# Patient Record
Sex: Female | Born: 1985 | Race: Black or African American | Hispanic: No | Marital: Single | Smoking: Current every day smoker
Health system: Southern US, Community
[De-identification: ages and names within clinical notes are randomized; demographics above are authoritative.]

## PROBLEM LIST (undated history)

## (undated) DIAGNOSIS — I10 Essential (primary) hypertension: Secondary | ICD-10-CM

## (undated) HISTORY — PX: TUBAL LIGATION: SHX77

---
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
## 2003-06-04 ENCOUNTER — Emergency Department (HOSPITAL_COMMUNITY): Admission: EM | Admit: 2003-06-04 | Discharge: 2003-06-04 | Payer: Self-pay | Admitting: Emergency Medicine

## 2003-12-25 ENCOUNTER — Inpatient Hospital Stay (HOSPITAL_COMMUNITY): Admission: AD | Admit: 2003-12-25 | Discharge: 2003-12-25 | Payer: Self-pay | Admitting: *Deleted

## 2003-12-25 ENCOUNTER — Observation Stay (HOSPITAL_COMMUNITY): Admission: AD | Admit: 2003-12-25 | Discharge: 2003-12-25 | Payer: Self-pay | Admitting: Obstetrics

## 2004-01-10 ENCOUNTER — Inpatient Hospital Stay (HOSPITAL_COMMUNITY): Admission: AD | Admit: 2004-01-10 | Discharge: 2004-01-10 | Payer: Self-pay | Admitting: Obstetrics

## 2004-01-13 ENCOUNTER — Inpatient Hospital Stay (HOSPITAL_COMMUNITY): Admission: AD | Admit: 2004-01-13 | Discharge: 2004-01-15 | Payer: Self-pay | Admitting: Obstetrics

## 2004-05-02 ENCOUNTER — Emergency Department (HOSPITAL_COMMUNITY): Admission: EM | Admit: 2004-05-02 | Discharge: 2004-05-02 | Payer: Self-pay | Admitting: Emergency Medicine

## 2004-05-05 ENCOUNTER — Emergency Department (HOSPITAL_COMMUNITY): Admission: EM | Admit: 2004-05-05 | Discharge: 2004-05-05 | Payer: Self-pay

## 2004-09-07 ENCOUNTER — Inpatient Hospital Stay (HOSPITAL_COMMUNITY): Admission: AD | Admit: 2004-09-07 | Discharge: 2004-09-07 | Payer: Self-pay | Admitting: *Deleted

## 2004-11-08 ENCOUNTER — Inpatient Hospital Stay (HOSPITAL_COMMUNITY): Admission: AD | Admit: 2004-11-08 | Discharge: 2004-11-08 | Payer: Self-pay | Admitting: Obstetrics

## 2004-11-19 ENCOUNTER — Inpatient Hospital Stay (HOSPITAL_COMMUNITY): Admission: AD | Admit: 2004-11-19 | Discharge: 2004-11-21 | Payer: Self-pay | Admitting: Obstetrics

## 2005-03-29 ENCOUNTER — Emergency Department (HOSPITAL_COMMUNITY): Admission: EM | Admit: 2005-03-29 | Discharge: 2005-03-29 | Payer: Self-pay | Admitting: Emergency Medicine

## 2005-11-18 ENCOUNTER — Emergency Department (HOSPITAL_COMMUNITY): Admission: EM | Admit: 2005-11-18 | Discharge: 2005-11-18 | Payer: Self-pay | Admitting: Family Medicine

## 2006-02-23 IMAGING — US US OB COMP LESS 14 WK
1 series · 14 of 28 positions shown · non-contrast
Comparison: none

CLINICAL DATA: Abdominal pain. 
TRANSABDOMINAL AND TRANSVAGINAL PELVIC FIRST TRIMESTER OB ULTRASOUND ? 05/02/04:
No prior studies.

[Series 1: unknown · 0.28mm/px · 14 of 55 slices shown]
[im 3/55]
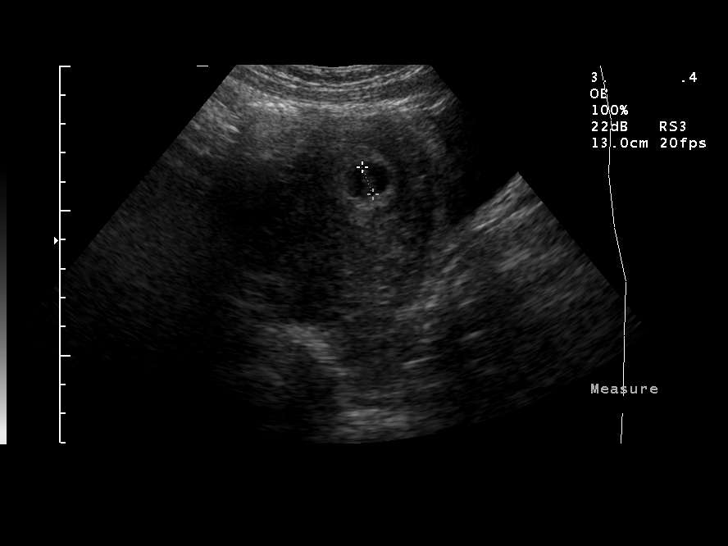
[im 7/55]
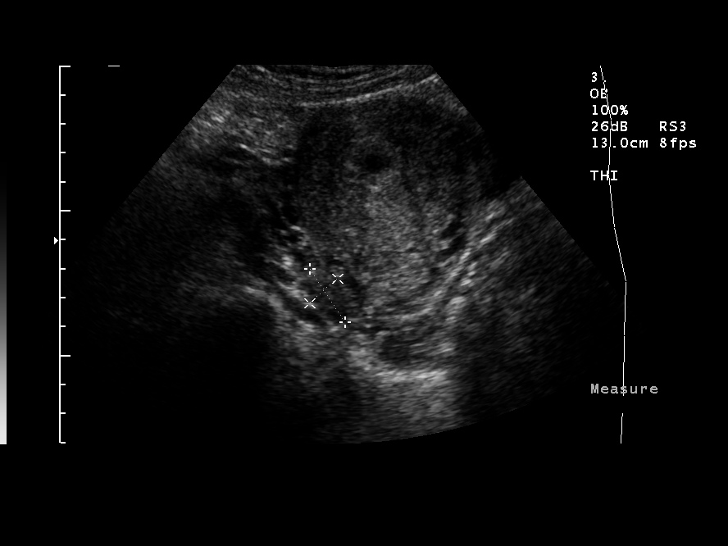
[im 11/55]
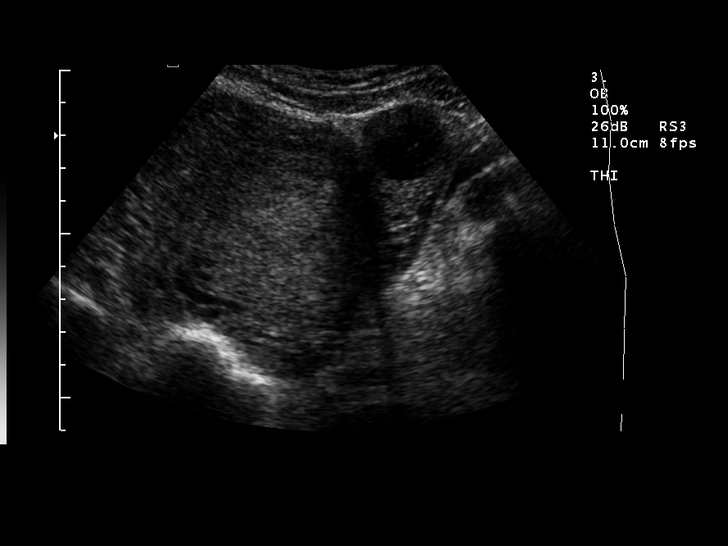
[im 15/55]
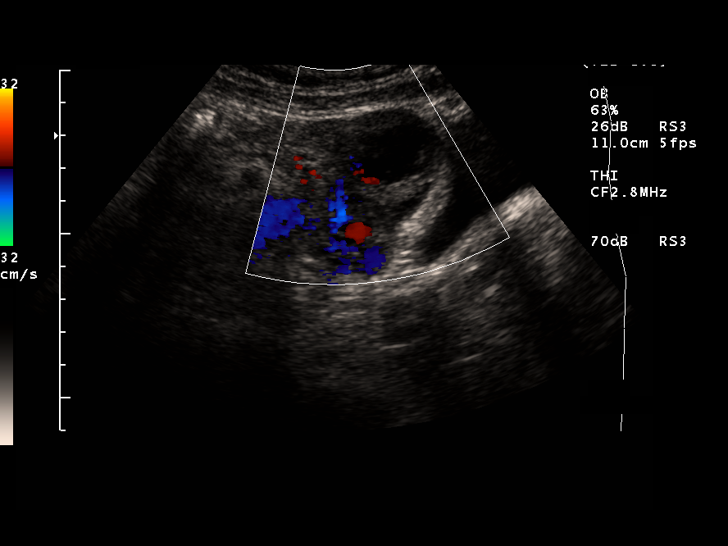
[im 19/55]
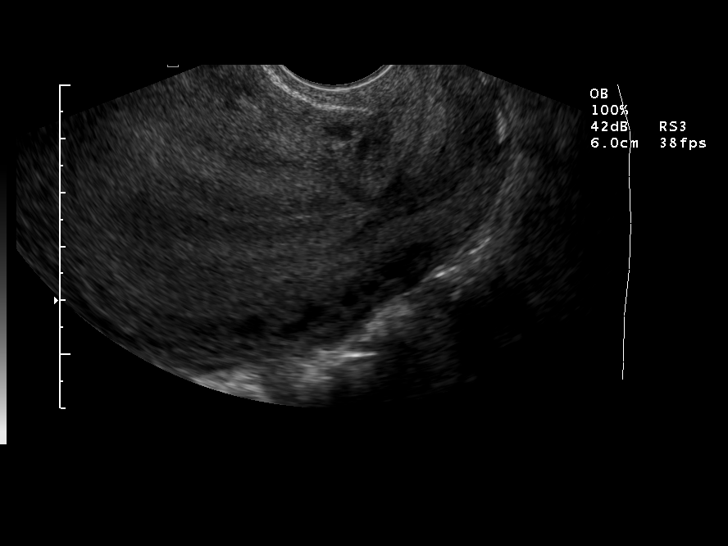
[im 23/55]
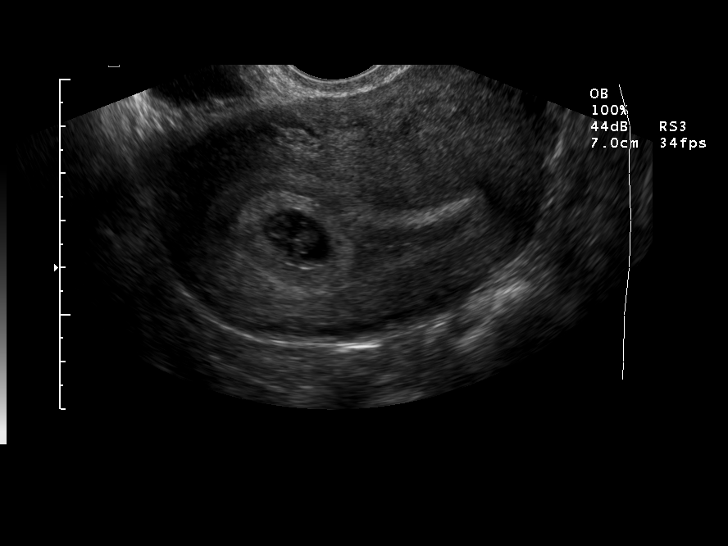
[im 27/55]
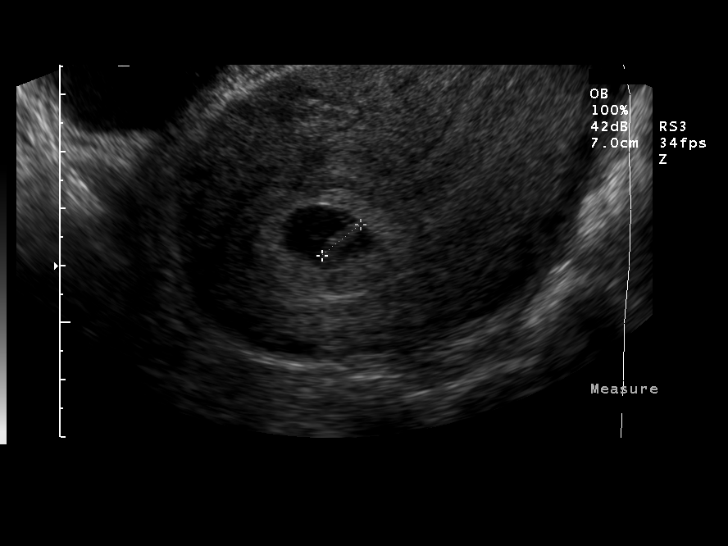
[im 31/55]
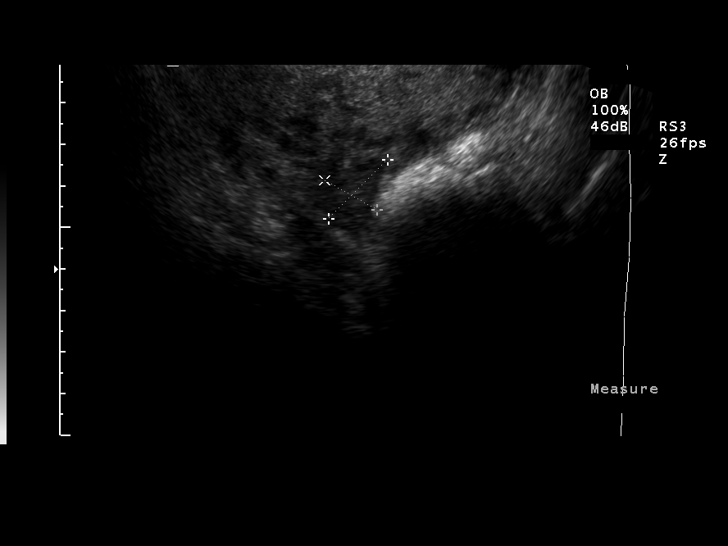
[im 35/55]
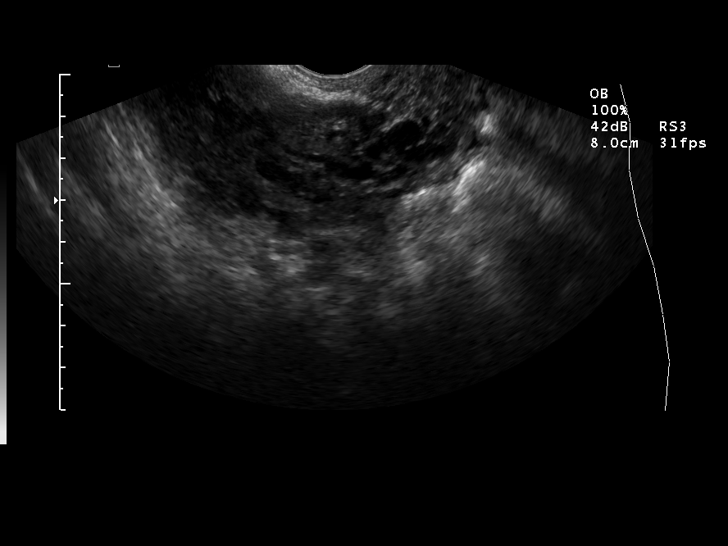
[im 39/55]
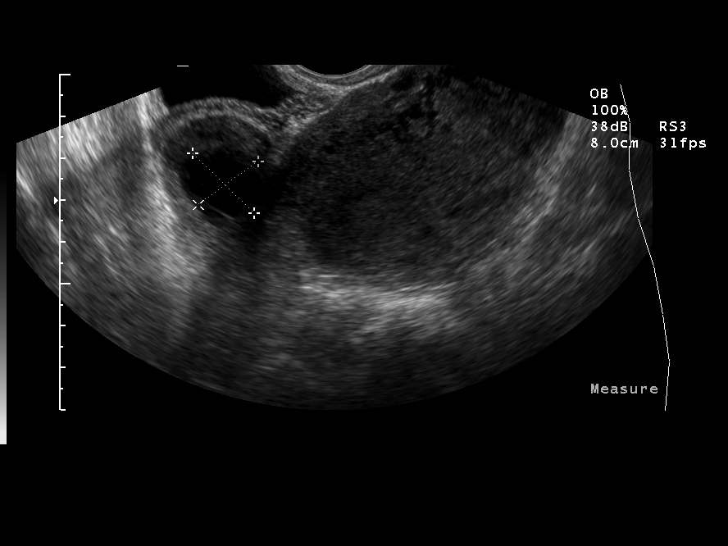
[im 43/55]
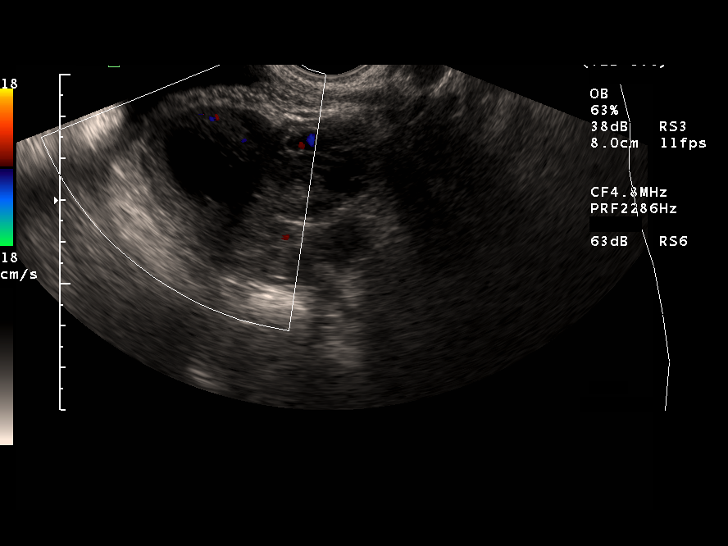
[im 47/55]
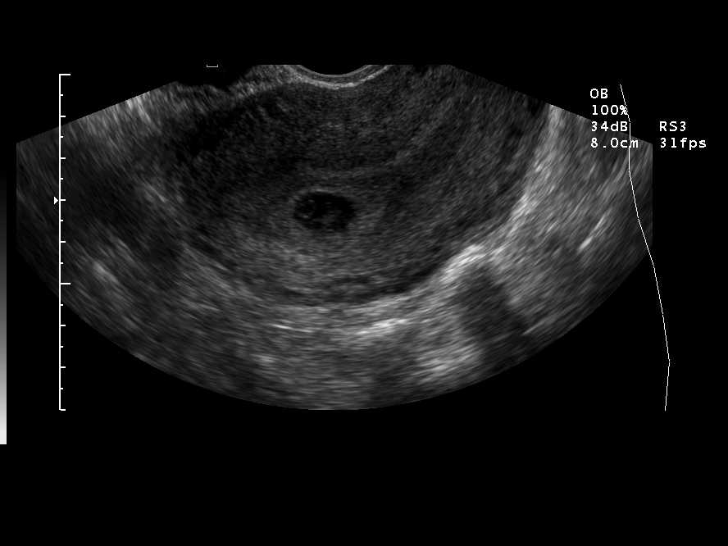
[im 51/55]
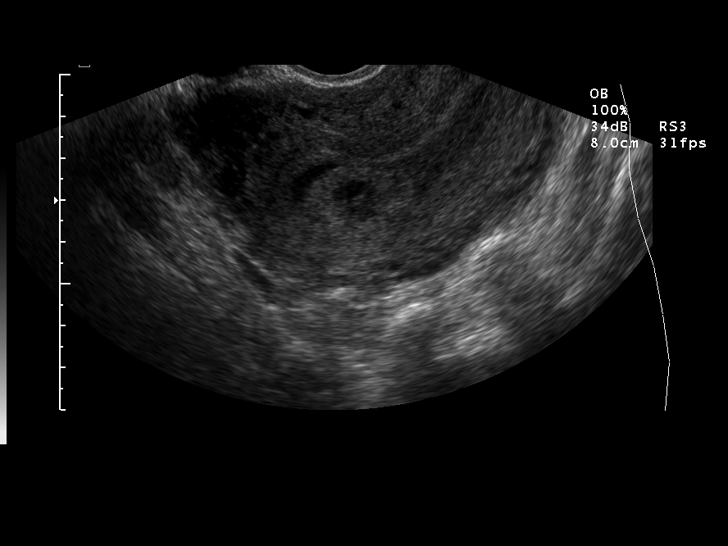
[im 55/55]
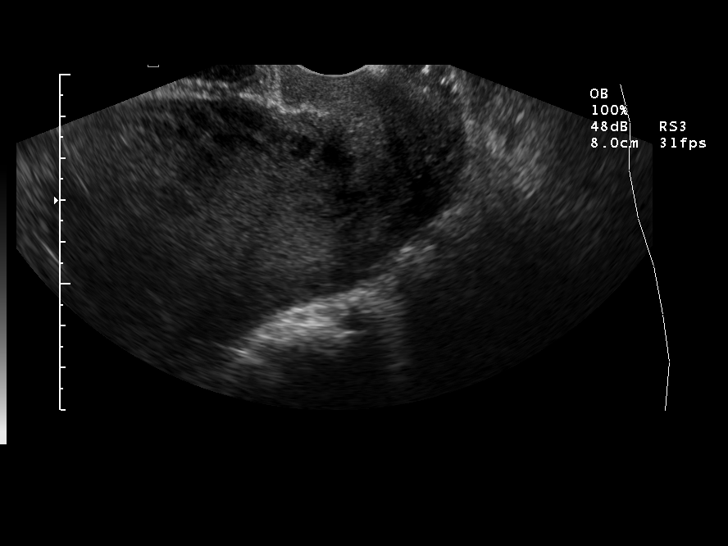

[14 of 28 positions shown; findings below may reference images not displayed]

FINDINGS: Transabdominal and transvaginal sonography was employed.   
There is an intrauterine gestational sac with a visualized embryo.  Crown/rump length is 8.3 mm consistent with 6 weeks, 6 days gestation.  Yolk sac is visible and fetal cardiac activity is identified at 142 beats per minute.  
There is a miniscule amount of hypoechogenicity along the fundal portion of the gestational sac potentially a manifestation of small subchorionic hemorrhage.  
The right ovary appears unremarkable.  The left ovary contains a 2.0 x 1.8 x 1.9 cm cystic lesion likely representing corpus luteum cyst.
IMPRESSION: 1. Single living intrauterine pregnancy measuring at 6 weeks, 6 days with fetal cardiac activity identified. No free pelvic fluid is noted.  
2.  Corpus luteum cyst of the left ovary.

## 2006-04-28 ENCOUNTER — Emergency Department (HOSPITAL_COMMUNITY): Admission: EM | Admit: 2006-04-28 | Discharge: 2006-04-28 | Payer: Self-pay | Admitting: Emergency Medicine

## 2006-06-24 ENCOUNTER — Inpatient Hospital Stay (HOSPITAL_COMMUNITY): Admission: AD | Admit: 2006-06-24 | Discharge: 2006-06-25 | Payer: Self-pay | Admitting: Obstetrics

## 2006-08-25 ENCOUNTER — Inpatient Hospital Stay (HOSPITAL_COMMUNITY): Admission: AD | Admit: 2006-08-25 | Discharge: 2006-08-25 | Payer: Self-pay | Admitting: Obstetrics

## 2006-10-15 ENCOUNTER — Ambulatory Visit (HOSPITAL_COMMUNITY): Admission: RE | Admit: 2006-10-15 | Discharge: 2006-10-15 | Payer: Self-pay | Admitting: Obstetrics

## 2007-01-07 ENCOUNTER — Inpatient Hospital Stay (HOSPITAL_COMMUNITY): Admission: AD | Admit: 2007-01-07 | Discharge: 2007-01-09 | Payer: Self-pay | Admitting: Obstetrics

## 2008-02-19 IMAGING — CR DG CHEST 2V
2 series · 2 of 2 positions shown · non-contrast
Comparison: None.

CLINICAL DATA: Chest pain

[view not recorded (1 of 2)]
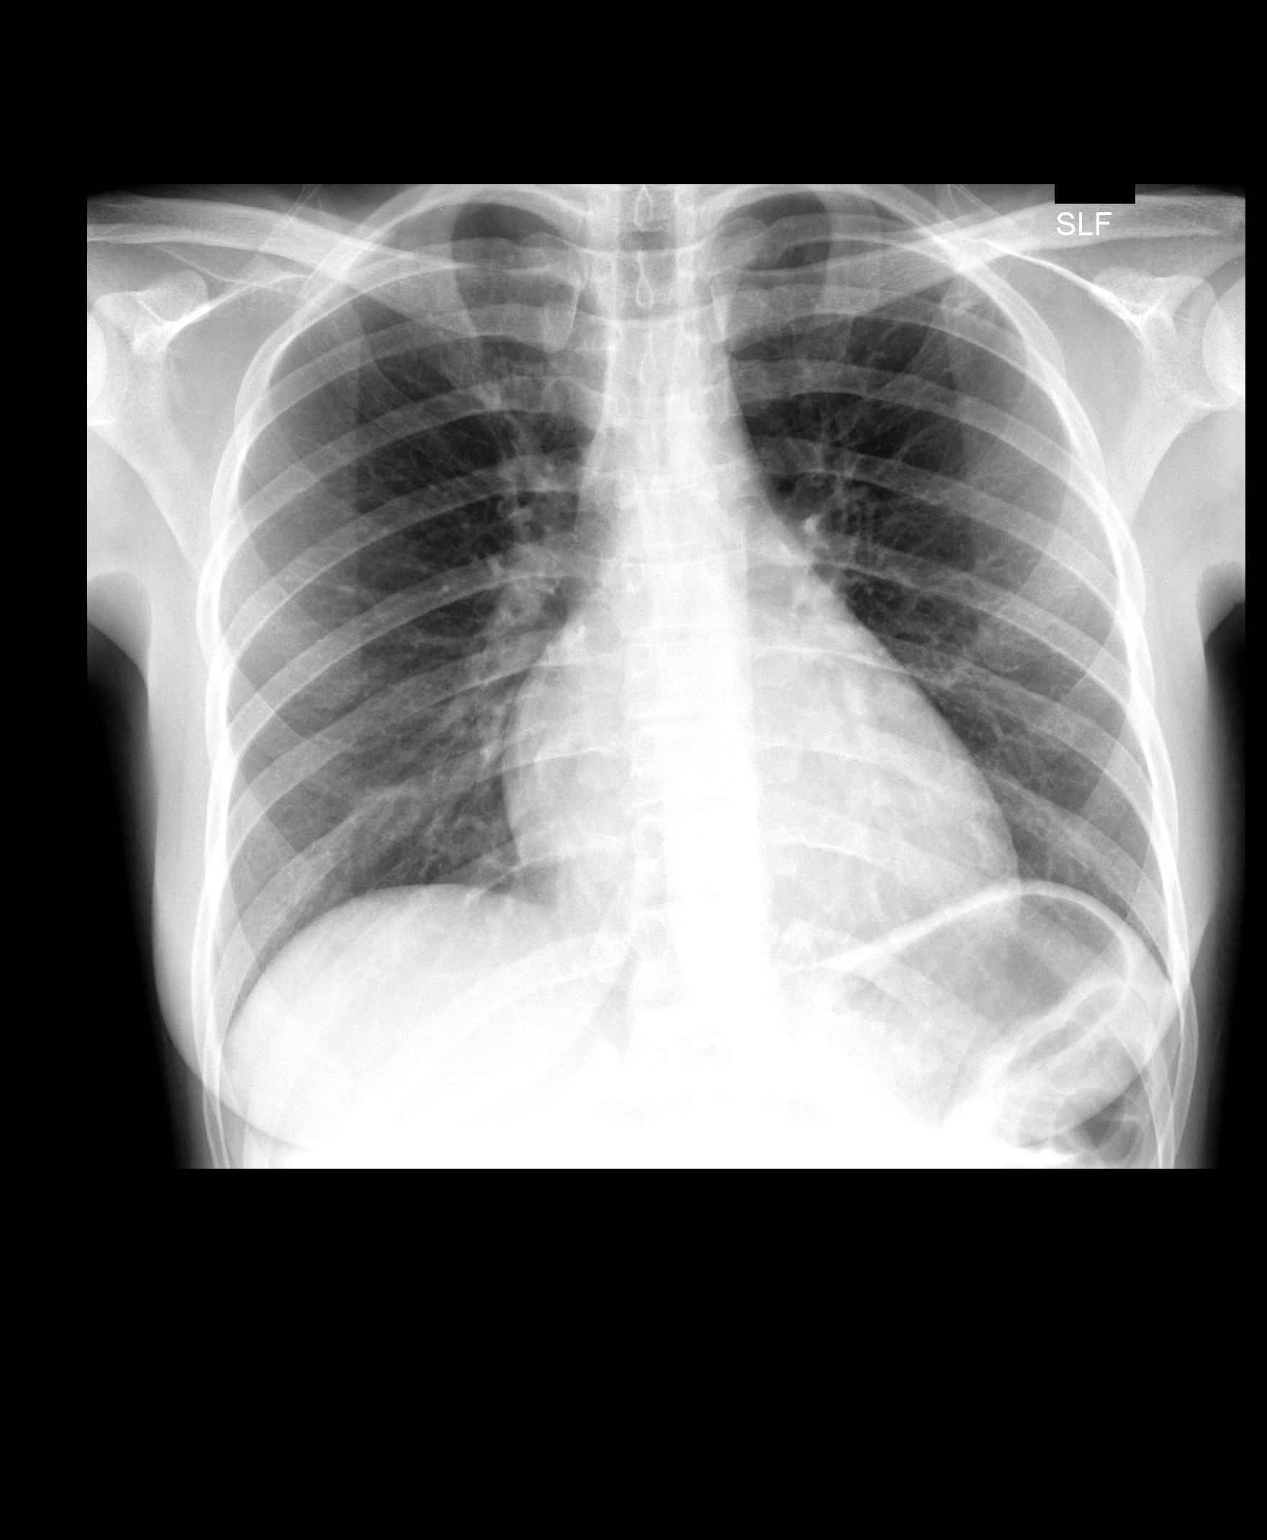

[view not recorded (2 of 2)]
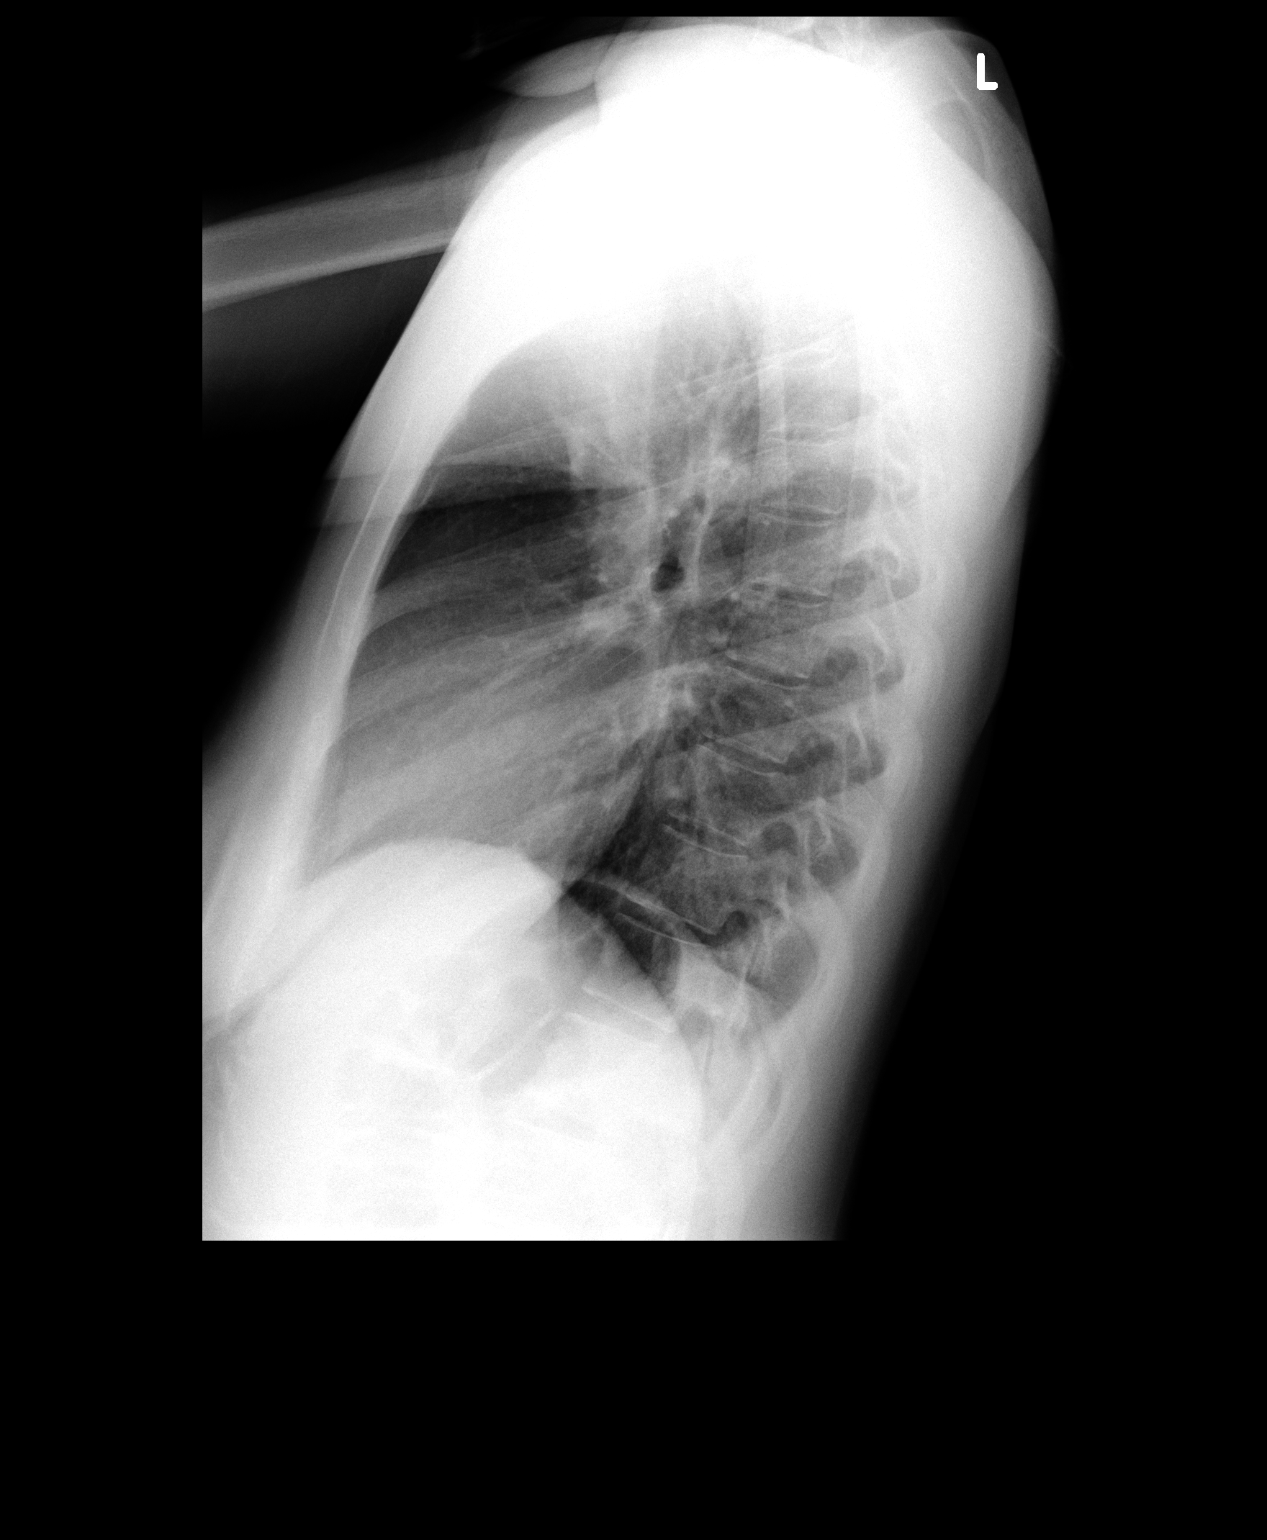

[2 of 2 positions shown; findings below may reference images not displayed]

CHEST - 2 VIEW:

The lungs are clear.  The cardiopericardial silhouette is within normal limits
for size.  Visualized bony structures of the thorax are intact.
IMPRESSION: No acute cardiopulmonary process

## 2008-07-17 ENCOUNTER — Emergency Department (HOSPITAL_COMMUNITY): Admission: EM | Admit: 2008-07-17 | Discharge: 2008-07-17 | Payer: Self-pay | Admitting: Family Medicine

## 2008-07-17 ENCOUNTER — Ambulatory Visit: Payer: Self-pay | Admitting: Obstetrics and Gynecology

## 2008-07-17 ENCOUNTER — Inpatient Hospital Stay (HOSPITAL_COMMUNITY): Admission: AD | Admit: 2008-07-17 | Discharge: 2008-07-17 | Payer: Self-pay | Admitting: Obstetrics & Gynecology

## 2008-07-28 ENCOUNTER — Inpatient Hospital Stay (HOSPITAL_COMMUNITY): Admission: AD | Admit: 2008-07-28 | Discharge: 2008-08-01 | Payer: Self-pay | Admitting: Obstetrics & Gynecology

## 2008-09-16 ENCOUNTER — Encounter: Payer: Self-pay | Admitting: Family

## 2008-09-16 ENCOUNTER — Ambulatory Visit: Payer: Self-pay | Admitting: Obstetrics & Gynecology

## 2008-09-16 ENCOUNTER — Encounter: Payer: Self-pay | Admitting: Advanced Practice Midwife

## 2008-09-16 LAB — CONVERTED CEMR LAB
Antibody Screen: NEGATIVE
Basophils Absolute: 0 10*3/uL
Basophils Relative: 0 %
Eosinophils Absolute: 0.5 10*3/uL
Eosinophils Relative: 4 %
HCT: 34.7 % — ABNORMAL LOW
Hemoglobin: 11.7 g/dL — ABNORMAL LOW
Hepatitis B Surface Ag: NEGATIVE
Hgb A2 Quant: 2.8 %
Hgb A: 96.7 % — ABNORMAL LOW
Hgb F Quant: 0.5 %
Hgb S Quant: 0 %
Lymphocytes Relative: 29 %
Lymphs Abs: 3.5 10*3/uL
MCHC: 33.7 g/dL
MCV: 96.1 fL
Monocytes Absolute: 1.2 10*3/uL — ABNORMAL HIGH
Monocytes Relative: 10 %
Neutro Abs: 6.6 10*3/uL
Neutrophils Relative %: 56 %
Platelets: 176 10*3/uL
RBC: 3.61 M/uL — ABNORMAL LOW
RDW: 14.2 %
Rh Type: POSITIVE
Rubella: 90.4 [IU]/mL — ABNORMAL HIGH
WBC: 11.8 10*3/uL — ABNORMAL HIGH

## 2008-09-26 ENCOUNTER — Inpatient Hospital Stay (HOSPITAL_COMMUNITY): Admission: AD | Admit: 2008-09-26 | Discharge: 2008-09-27 | Payer: Self-pay | Admitting: Obstetrics & Gynecology

## 2008-09-26 ENCOUNTER — Ambulatory Visit: Payer: Self-pay | Admitting: Advanced Practice Midwife

## 2008-10-07 ENCOUNTER — Ambulatory Visit: Payer: Self-pay | Admitting: Obstetrics & Gynecology

## 2008-10-21 ENCOUNTER — Encounter: Payer: Self-pay | Admitting: Family

## 2008-10-21 ENCOUNTER — Ambulatory Visit: Payer: Self-pay | Admitting: Obstetrics & Gynecology

## 2008-10-29 ENCOUNTER — Inpatient Hospital Stay (HOSPITAL_COMMUNITY): Admission: AD | Admit: 2008-10-29 | Discharge: 2008-10-29 | Payer: Self-pay | Admitting: Obstetrics & Gynecology

## 2008-10-29 ENCOUNTER — Ambulatory Visit: Payer: Self-pay | Admitting: Obstetrics and Gynecology

## 2008-11-04 ENCOUNTER — Encounter: Payer: Self-pay | Admitting: Family

## 2008-11-04 ENCOUNTER — Ambulatory Visit: Payer: Self-pay | Admitting: Obstetrics & Gynecology

## 2008-11-05 ENCOUNTER — Encounter: Payer: Self-pay | Admitting: Family

## 2008-11-05 LAB — CONVERTED CEMR LAB
Trich, Wet Prep: NONE SEEN
Yeast Wet Prep HPF POC: NONE SEEN

## 2008-11-06 ENCOUNTER — Ambulatory Visit: Payer: Self-pay | Admitting: Obstetrics and Gynecology

## 2008-11-11 ENCOUNTER — Ambulatory Visit: Payer: Self-pay | Admitting: Obstetrics & Gynecology

## 2008-11-18 ENCOUNTER — Ambulatory Visit: Payer: Self-pay | Admitting: Obstetrics & Gynecology

## 2008-11-23 ENCOUNTER — Inpatient Hospital Stay (HOSPITAL_COMMUNITY): Admission: AD | Admit: 2008-11-23 | Discharge: 2008-11-24 | Payer: Self-pay | Admitting: Obstetrics & Gynecology

## 2008-11-23 ENCOUNTER — Ambulatory Visit: Payer: Self-pay | Admitting: Obstetrics & Gynecology

## 2009-02-04 ENCOUNTER — Inpatient Hospital Stay (HOSPITAL_COMMUNITY): Admission: EM | Admit: 2009-02-04 | Discharge: 2009-02-08 | Payer: Self-pay | Admitting: Emergency Medicine

## 2009-02-04 ENCOUNTER — Ambulatory Visit: Payer: Self-pay | Admitting: Internal Medicine

## 2009-02-05 ENCOUNTER — Encounter: Payer: Self-pay | Admitting: Internal Medicine

## 2009-02-06 ENCOUNTER — Encounter (INDEPENDENT_AMBULATORY_CARE_PROVIDER_SITE_OTHER): Payer: Self-pay | Admitting: Surgery

## 2009-05-09 ENCOUNTER — Emergency Department (HOSPITAL_COMMUNITY): Admission: EM | Admit: 2009-05-09 | Discharge: 2009-05-09 | Payer: Self-pay | Admitting: Emergency Medicine

## 2009-12-10 ENCOUNTER — Emergency Department (HOSPITAL_COMMUNITY): Admission: EM | Admit: 2009-12-10 | Discharge: 2009-12-10 | Payer: Self-pay | Admitting: Family Medicine

## 2010-02-05 ENCOUNTER — Emergency Department (HOSPITAL_COMMUNITY): Admission: EM | Admit: 2010-02-05 | Discharge: 2010-02-05 | Payer: Self-pay | Admitting: Emergency Medicine

## 2010-06-28 ENCOUNTER — Inpatient Hospital Stay (INDEPENDENT_AMBULATORY_CARE_PROVIDER_SITE_OTHER)
Admission: RE | Admit: 2010-06-28 | Discharge: 2010-06-28 | Disposition: A | Discharge status: Home / Self Care | Payer: Self-pay | Source: Ambulatory Visit | Attending: Family Medicine | Admitting: Family Medicine

## 2010-06-28 DIAGNOSIS — G44209 Tension-type headache, unspecified, not intractable: Secondary | ICD-10-CM

## 2010-07-06 ENCOUNTER — Emergency Department (HOSPITAL_COMMUNITY)
Admission: EM | Admit: 2010-07-06 | Discharge: 2010-07-06 | Disposition: A | Discharge status: Home / Self Care | Payer: Self-pay | Attending: Emergency Medicine | Admitting: Emergency Medicine

## 2010-07-06 DIAGNOSIS — R51 Headache: Secondary | ICD-10-CM | POA: Insufficient documentation

## 2010-07-06 DIAGNOSIS — S0990XA Unspecified injury of head, initial encounter: Secondary | ICD-10-CM | POA: Insufficient documentation

## 2010-07-06 DIAGNOSIS — S0100XA Unspecified open wound of scalp, initial encounter: Secondary | ICD-10-CM | POA: Insufficient documentation

## 2010-07-15 LAB — POCT URINALYSIS DIPSTICK
Ketones, ur: NEGATIVE mg/dL
Protein, ur: NEGATIVE mg/dL
Specific Gravity, Urine: 1.02 (ref 1.005–1.030)
pH: 6.5 (ref 5.0–8.0)

## 2010-07-16 LAB — WET PREP, GENITAL
Trich, Wet Prep: NONE SEEN
Yeast Wet Prep HPF POC: NONE SEEN

## 2010-07-16 LAB — URINALYSIS, ROUTINE W REFLEX MICROSCOPIC
Glucose, UA: NEGATIVE mg/dL
Ketones, ur: NEGATIVE mg/dL
Nitrite: NEGATIVE
Protein, ur: 30 mg/dL — AB

## 2010-07-16 LAB — URINE CULTURE: Colony Count: >=100000

## 2010-07-16 LAB — URINE MICROSCOPIC-ADD ON

## 2010-08-04 LAB — CBC
HCT: 35.3 % — ABNORMAL LOW (ref 36.0–46.0)
HCT: 36.8 % (ref 36.0–46.0)
HCT: 39 % (ref 36.0–46.0)
Hemoglobin: 11.6 g/dL — ABNORMAL LOW (ref 12.0–15.0)
Hemoglobin: 12.5 g/dL (ref 12.0–15.0)
MCHC: 32.1 g/dL (ref 30.0–36.0)
MCHC: 33 g/dL (ref 30.0–36.0)
MCV: 95.7 fL (ref 78.0–100.0)
MCV: 96.7 fL (ref 78.0–100.0)
MCV: 96.7 fL (ref 78.0–100.0)
MCV: 97.4 fL (ref 78.0–100.0)
Platelets: 137 10*3/uL — ABNORMAL LOW (ref 150–400)
Platelets: 175 10*3/uL (ref 150–400)
RBC: 3.81 MIL/uL — ABNORMAL LOW (ref 3.87–5.11)
RBC: 4.04 MIL/uL (ref 3.87–5.11)
RBC: 4.14 MIL/uL (ref 3.87–5.11)
RDW: 12.6 % (ref 11.5–15.5)
RDW: 13 % (ref 11.5–15.5)
WBC: 15.6 10*3/uL — ABNORMAL HIGH (ref 4.0–10.5)
WBC: 7.2 10*3/uL (ref 4.0–10.5)
WBC: 8.3 10*3/uL (ref 4.0–10.5)

## 2010-08-04 LAB — COMPREHENSIVE METABOLIC PANEL
ALT: 65 U/L — ABNORMAL HIGH (ref 0–35)
AST: 98 U/L — ABNORMAL HIGH (ref 0–37)
Alkaline Phosphatase: 82 U/L (ref 39–117)
CO2: 24 mEq/L (ref 19–32)
Chloride: 107 mEq/L (ref 96–112)
GFR calc Af Amer: >60 mL/min (ref >60)
GFR calc non Af Amer: >60 mL/min (ref >60)
Potassium: 3.7 mEq/L (ref 3.5–5.1)
Sodium: 138 mEq/L (ref 135–145)
Total Bilirubin: 0.4 mg/dL (ref 0.3–1.2)

## 2010-08-04 LAB — DIFFERENTIAL
Basophils Absolute: 0 10*3/uL (ref 0.0–0.1)
Basophils Absolute: 0.1 10*3/uL (ref 0.0–0.1)
Basophils Relative: 1 % (ref 0–1)
Eosinophils Absolute: 0.1 10*3/uL (ref 0.0–0.7)
Eosinophils Absolute: 0.3 10*3/uL (ref 0.0–0.7)
Eosinophils Relative: 1 % (ref 0–5)
Eosinophils Relative: 5 % (ref 0–5)
Monocytes Absolute: 0.6 10*3/uL (ref 0.1–1.0)
Monocytes Absolute: 0.7 10*3/uL (ref 0.1–1.0)
Monocytes Relative: 8 % (ref 3–12)
Neutro Abs: 3.3 10*3/uL (ref 1.7–7.7)

## 2010-08-04 LAB — URINALYSIS, ROUTINE W REFLEX MICROSCOPIC
Bilirubin Urine: NEGATIVE
Glucose, UA: NEGATIVE mg/dL
Hgb urine dipstick: NEGATIVE
Specific Gravity, Urine: 1.024 (ref 1.005–1.030)
Urobilinogen, UA: 0.2 mg/dL (ref 0.0–1.0)

## 2010-08-04 LAB — BASIC METABOLIC PANEL
CO2: 24 mEq/L (ref 19–32)
Calcium: 8.6 mg/dL (ref 8.4–10.5)
Calcium: 9.1 mg/dL (ref 8.4–10.5)
Chloride: 110 mEq/L (ref 96–112)
GFR calc Af Amer: >60 mL/min (ref >60)
GFR calc non Af Amer: >60 mL/min (ref >60)
Glucose, Bld: 85 mg/dL (ref 70–99)
Potassium: 3.9 mEq/L (ref 3.5–5.1)
Sodium: 138 mEq/L (ref 135–145)
Sodium: 140 mEq/L (ref 135–145)

## 2010-08-04 LAB — LIPASE, BLOOD
Lipase: 18 U/L (ref 11–59)
Lipase: 22 U/L (ref 11–59)

## 2010-08-04 LAB — HEPATIC FUNCTION PANEL
ALT: 56 U/L — ABNORMAL HIGH (ref 0–35)
AST: 35 U/L (ref 0–37)
Alkaline Phosphatase: 65 U/L (ref 39–117)
Bilirubin, Direct: 0.1 mg/dL (ref 0.0–0.3)
Bilirubin, Direct: 0.1 mg/dL (ref 0.0–0.3)
Indirect Bilirubin: 0.3 mg/dL (ref 0.3–0.9)
Indirect Bilirubin: 0.6 mg/dL (ref 0.3–0.9)
Total Bilirubin: 0.4 mg/dL (ref 0.3–1.2)
Total Protein: 5.7 g/dL — ABNORMAL LOW (ref 6.0–8.3)

## 2010-08-04 LAB — URINE MICROSCOPIC-ADD ON

## 2010-08-04 LAB — BILIRUBIN, TOTAL: Total Bilirubin: 0.6 mg/dL (ref 0.3–1.2)

## 2010-08-04 LAB — URINE CULTURE: Colony Count: NO GROWTH

## 2010-08-07 LAB — CBC
HCT: 37.3 % (ref 36.0–46.0)
Hemoglobin: 12.7 g/dL (ref 12.0–15.0)
MCHC: 34.1 g/dL (ref 30.0–36.0)
MCHC: 35.3 g/dL (ref 30.0–36.0)
MCV: 97.3 fL (ref 78.0–100.0)
MCV: 97.7 fL (ref 78.0–100.0)
Platelets: 123 10*3/uL — ABNORMAL LOW (ref 150–400)
Platelets: 163 10*3/uL (ref 150–400)
RDW: 13.4 % (ref 11.5–15.5)
RDW: 13.8 % (ref 11.5–15.5)

## 2010-08-07 LAB — POCT URINALYSIS DIP (DEVICE)
Bilirubin Urine: NEGATIVE
Hgb urine dipstick: NEGATIVE
Ketones, ur: NEGATIVE mg/dL
Protein, ur: NEGATIVE mg/dL
pH: 6.5 (ref 5.0–8.0)

## 2010-08-08 LAB — POCT URINALYSIS DIP (DEVICE)
Bilirubin Urine: NEGATIVE
Bilirubin Urine: NEGATIVE
Bilirubin Urine: NEGATIVE
Glucose, UA: NEGATIVE mg/dL
Glucose, UA: NEGATIVE mg/dL
Glucose, UA: NEGATIVE mg/dL
Glucose, UA: NEGATIVE mg/dL
Hgb urine dipstick: NEGATIVE
Ketones, ur: NEGATIVE mg/dL
Ketones, ur: NEGATIVE mg/dL
Nitrite: NEGATIVE
Nitrite: NEGATIVE
Nitrite: NEGATIVE
Protein, ur: NEGATIVE mg/dL
Specific Gravity, Urine: 1.015 (ref 1.005–1.030)
Urobilinogen, UA: 0.2 mg/dL (ref 0.0–1.0)
Urobilinogen, UA: 1 mg/dL (ref 0.0–1.0)
pH: 6.5 (ref 5.0–8.0)

## 2010-08-09 LAB — POCT URINALYSIS DIP (DEVICE)
Bilirubin Urine: NEGATIVE
Glucose, UA: NEGATIVE mg/dL
Hgb urine dipstick: NEGATIVE
Ketones, ur: NEGATIVE mg/dL
Nitrite: NEGATIVE

## 2010-08-09 LAB — COMPREHENSIVE METABOLIC PANEL
ALT: 16 U/L (ref 0–35)
BUN: 2 mg/dL — ABNORMAL LOW (ref 6–23)
CO2: 22 mEq/L (ref 19–32)
Calcium: 8.6 mg/dL (ref 8.4–10.5)
Creatinine, Ser: 0.42 mg/dL (ref 0.4–1.2)
GFR calc non Af Amer: >60 mL/min (ref >60)
Glucose, Bld: 97 mg/dL (ref 70–99)

## 2010-08-09 LAB — CBC
HCT: 32.5 % — ABNORMAL LOW (ref 36.0–46.0)
Hemoglobin: 11.5 g/dL — ABNORMAL LOW (ref 12.0–15.0)
MCHC: 35.5 g/dL (ref 30.0–36.0)
MCV: 96.3 fL (ref 78.0–100.0)
RBC: 3.37 MIL/uL — ABNORMAL LOW (ref 3.87–5.11)

## 2010-08-09 LAB — DIFFERENTIAL
Eosinophils Absolute: 0.4 10*3/uL (ref 0.0–0.7)
Lymphocytes Relative: 20 % (ref 12–46)
Lymphs Abs: 2.5 10*3/uL (ref 0.7–4.0)
Neutrophils Relative %: 69 % (ref 43–77)

## 2010-08-09 LAB — WET PREP, GENITAL: Yeast Wet Prep HPF POC: NONE SEEN

## 2010-08-09 LAB — GC/CHLAMYDIA PROBE AMP, GENITAL: Chlamydia, DNA Probe: NEGATIVE

## 2010-08-10 LAB — CBC
Hemoglobin: 10 g/dL — ABNORMAL LOW (ref 12.0–15.0)
MCHC: 34.2 g/dL (ref 30.0–36.0)
RBC: 3.09 MIL/uL — ABNORMAL LOW (ref 3.87–5.11)
WBC: 15.6 10*3/uL — ABNORMAL HIGH (ref 4.0–10.5)

## 2010-08-10 LAB — DIFFERENTIAL
Lymphocytes Relative: 11 % — ABNORMAL LOW (ref 12–46)
Lymphs Abs: 1.7 10*3/uL (ref 0.7–4.0)
Monocytes Absolute: 1.8 10*3/uL — ABNORMAL HIGH (ref 0.1–1.0)
Monocytes Relative: 12 % (ref 3–12)
Neutro Abs: 11.7 10*3/uL — ABNORMAL HIGH (ref 1.7–7.7)
Neutrophils Relative %: 75 % (ref 43–77)

## 2010-08-11 LAB — URINALYSIS, ROUTINE W REFLEX MICROSCOPIC
Ketones, ur: >80 mg/dL — AB
Specific Gravity, Urine: 1.015 (ref 1.005–1.030)
Urobilinogen, UA: 1 mg/dL (ref 0.0–1.0)

## 2010-08-11 LAB — CBC
HCT: 31.5 % — ABNORMAL LOW (ref 36.0–46.0)
Hemoglobin: 11.4 g/dL — ABNORMAL LOW (ref 12.0–15.0)
MCHC: 34.2 g/dL (ref 30.0–36.0)
MCV: 95.2 fL (ref 78.0–100.0)
RBC: 3.3 MIL/uL — ABNORMAL LOW (ref 3.87–5.11)
RBC: 3.51 MIL/uL — ABNORMAL LOW (ref 3.87–5.11)
WBC: 19.5 10*3/uL — ABNORMAL HIGH (ref 4.0–10.5)
WBC: 20.7 10*3/uL — ABNORMAL HIGH (ref 4.0–10.5)

## 2010-08-11 LAB — COMPREHENSIVE METABOLIC PANEL
BUN: 1 mg/dL — ABNORMAL LOW (ref 6–23)
CO2: 21 mEq/L (ref 19–32)
Chloride: 104 mEq/L (ref 96–112)
Creatinine, Ser: 0.43 mg/dL (ref 0.4–1.2)
GFR calc non Af Amer: >60 mL/min (ref >60)
Glucose, Bld: 77 mg/dL (ref 70–99)
Total Bilirubin: 0.4 mg/dL (ref 0.3–1.2)

## 2010-08-11 LAB — URINE CULTURE: Colony Count: >=100000

## 2010-08-11 LAB — WET PREP, GENITAL: Yeast Wet Prep HPF POC: NONE SEEN

## 2010-08-11 LAB — POCT URINALYSIS DIP (DEVICE)
Bilirubin Urine: NEGATIVE
Glucose, UA: NEGATIVE mg/dL
Ketones, ur: NEGATIVE mg/dL
Nitrite: POSITIVE — AB
Specific Gravity, Urine: 1.02 (ref 1.005–1.030)
pH: 6 (ref 5.0–8.0)

## 2010-08-11 LAB — GC/CHLAMYDIA PROBE AMP, GENITAL
Chlamydia, DNA Probe: POSITIVE — AB
GC Probe Amp, Genital: POSITIVE — AB

## 2010-08-11 LAB — URINE MICROSCOPIC-ADD ON

## 2010-09-13 NOTE — Op Note (Signed)
Theresa Avila, Theresa Avila                 ACCOUNT NO.:  [id number]   MEDICAL RECORD NO.:  [id number]          PATIENT TYPE:  INP   LOCATION:  9111                          FACILITY:  [facility]   PHYSICIAN:  Scheryl Darter, MD       DATE OF BIRTH:  12-26-1985   DATE OF PROCEDURE:  11/23/2008  DATE OF DISCHARGE:                               OPERATIVE REPORT   PREOPERATIVE DIAGNOSIS:  Undesired fertility.   POSTOPERATIVE DIAGNOSES:  1. Tubal sterilization.  2. Pelvic adhesion.   PROCEDURE:  Postpartum bilateral tubal ligation.   SURGEON:  Scheryl Darter, MD   ANESTHESIA:  Spinal.   ESTIMATED BLOOD LOSS:  Negligible.   SPECIMENS:  None.   DRAINS:  None.   COUNTS:  Correct.   HOSPITAL COURSE:  The patient gave written consent for postpartum tubal  ligation.  The patient identification was confirmed.  She was brought to  the operating room and spinal anesthesia was induced.  She was placed in  dorsal supine position.  Abdomen was sterilely prepped and draped, and  bladder was drained with a Foley catheter.  Foley was not kept in situ.  A #11 blade was used to make transverse infraumbilical skin incision  about 2 cm across.  Fascia was elevated and incised and peritoneal  cavity was entered.  The abdominal cavity was inspected and the right  fallopian tube was identified and traced to its distal fimbriated end.  A Filshie clip was applied about 1/3rd down the tube from the uterus  with good positioning seen.  Omental adhesion to anterior abdominal wall  was sliced after ligating with 0 Vicryl and cutting with scissors.  The  left fallopian tube was identified and likewise a Filshie clip was  applied at about the proximal 1/3rd section of the tube.  The fascia and  peritoneum were closed with a  running suture with 0 Vicryl and skin was closed with interrupted  subcuticular sutures of 4-0 Vicryl.  Sterile dressing was applied.  The  patient tolerated the procedure well without complications  and she was  brought in stable condition to recovery room.       Scheryl Darter, MD  Electronically Signed     JA/MEDQ  D:  11/23/2008  T:  11/24/2008  Job:  [id number]

## 2010-09-16 NOTE — Discharge Summary (Signed)
Theresa Avila, Theresa Avila                 ACCOUNT NO.:  [id number]   MEDICAL RECORD NO.:  [id number]          PATIENT TYPE:  INP   LOCATION:  9311                          FACILITY:  [facility]   PHYSICIAN:  Roseanna Rainbow, M.D.DATE OF BIRTH:  09-Jun-1985   DATE OF ADMISSION:  07/28/2008  DATE OF DISCHARGE:  08/01/2008                               DISCHARGE SUMMARY   CHIEF COMPLAINT:  The patient is a 25 year old with a second trimester  intrauterine pregnancy recently treated for cervicitis, now with rule  out pyelonephritis.  Please see the dictated history and physical as per  the mid-level Latrisha Coiro.   HOSPITAL COURSE:  The patient was admitted.  She was started on  parenteral antibiotics.  On hospital day#1, her white blood cell count  was 19,500.  On admission, the white blood cell count was 20,700.  Her  flank pain improved.  An ultrasound showed a 21-week living intrauterine  pregnancy.  On hospital day #3, her white blood cell count was 15,600.  She continued to improve symptomatically and she was subsequently  discharged to home on April 3.   DISCHARGE DIAGNOSES:  Pyelonephritis, intrauterine pregnancy at 21 plus  weeks.   CONDITION:  Improved.   DIET:  Regular.   ACTIVITY:  Ad lib.   MEDICATIONS:  1. Ampicillin 500 mg q.6 for 7 days.  2. Tylenol as needed.  3. Prenatal vitamins.   DISPOSITION:  The patient was to follow up in the office in 2 weeks.      Roseanna Rainbow, M.D.  Electronically Signed     LAJ/MEDQ  D:  08/20/2008  T:  08/20/2008  Job:  [id number]

## 2010-11-05 ENCOUNTER — Inpatient Hospital Stay (INDEPENDENT_AMBULATORY_CARE_PROVIDER_SITE_OTHER)
Admission: RE | Admit: 2010-11-05 | Discharge: 2010-11-05 | Disposition: A | Discharge status: Home / Self Care | Payer: Self-pay | Source: Ambulatory Visit | Attending: Family Medicine | Admitting: Family Medicine

## 2010-11-05 DIAGNOSIS — S0100XA Unspecified open wound of scalp, initial encounter: Secondary | ICD-10-CM

## 2010-11-28 IMAGING — US US ABDOMEN COMPLETE
1 series · 13 of 25 positions shown · non-contrast
Comparison: None relevant.

CLINICAL DATA: Right upper quadrant abdominal pain with nausea and
vomiting.

COMPLETE ABDOMINAL ULTRASOUND

[Series 1: us abdomen complete · 0.28mm/px · 13 of 86 slices shown]
[im 1/86]
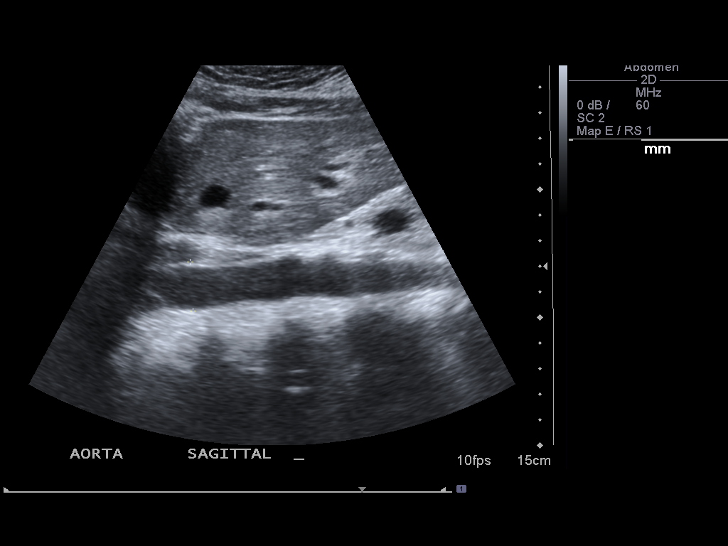
[im 8/86]
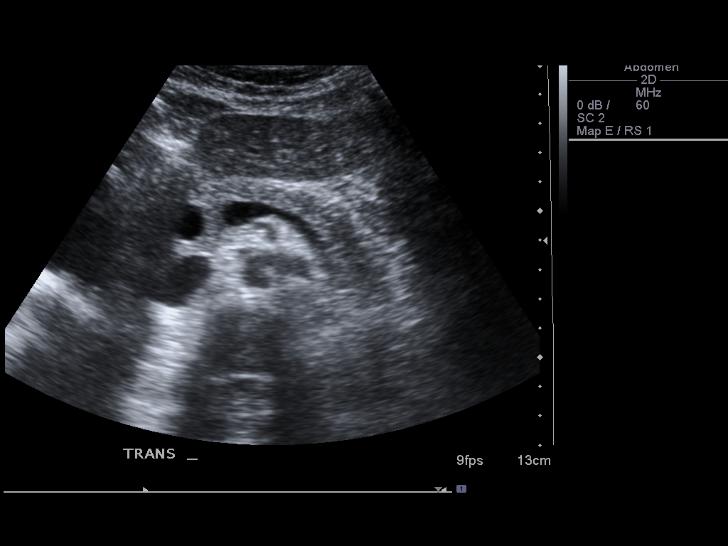
[im 15/86]
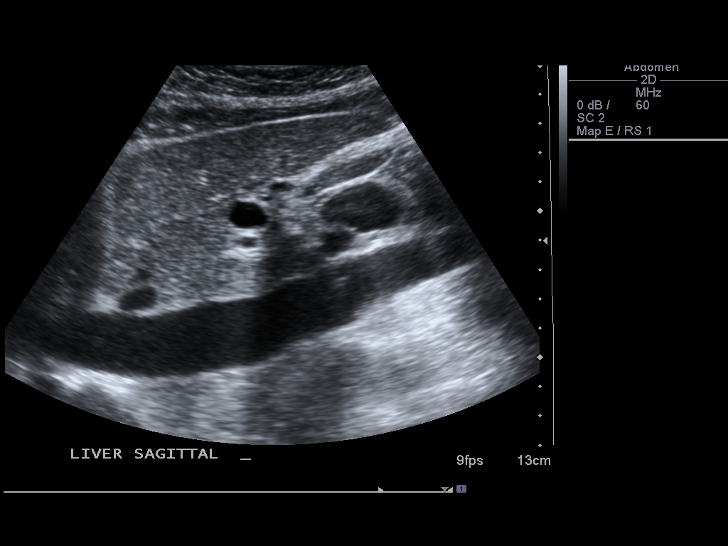
[im 22/86]
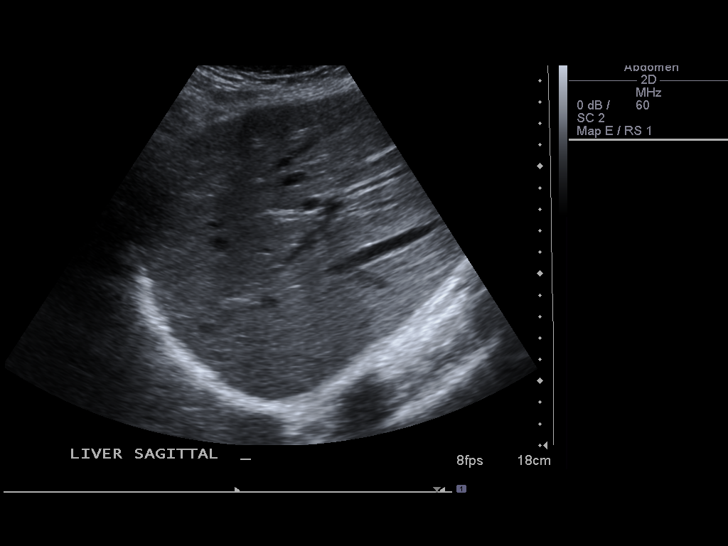
[im 29/86]
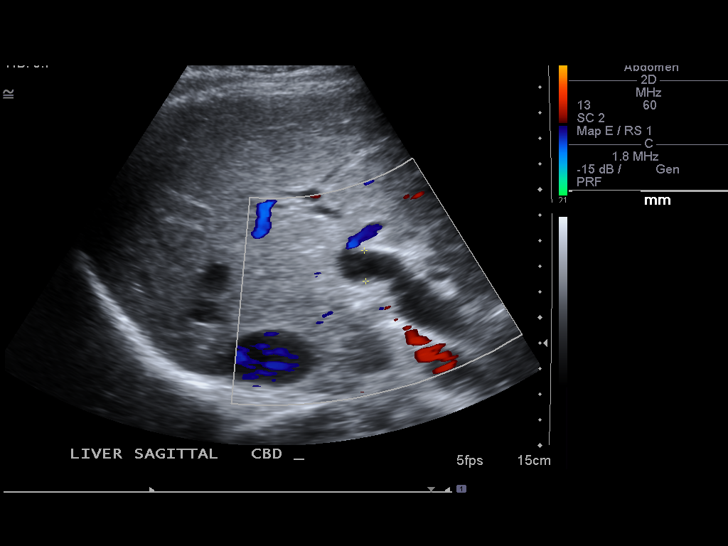
[im 36/86]
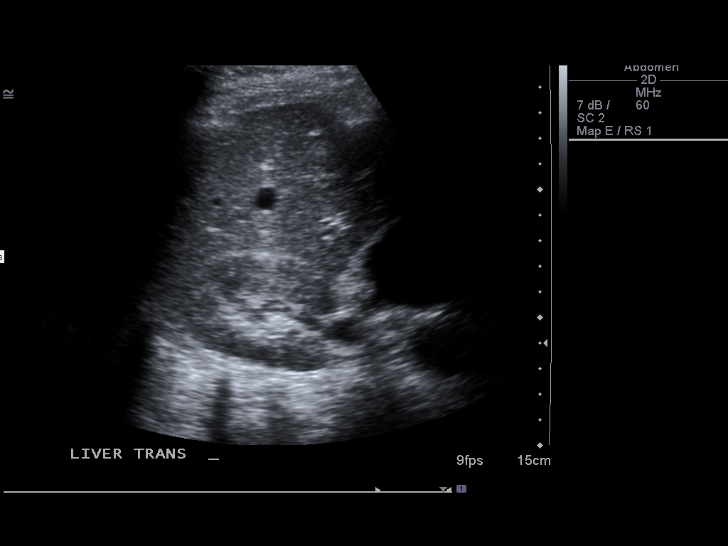
[im 43/86]
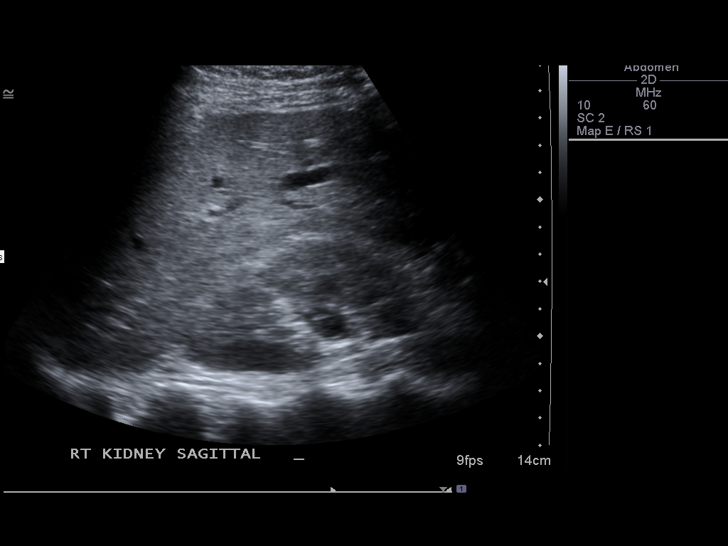
[im 50/86]
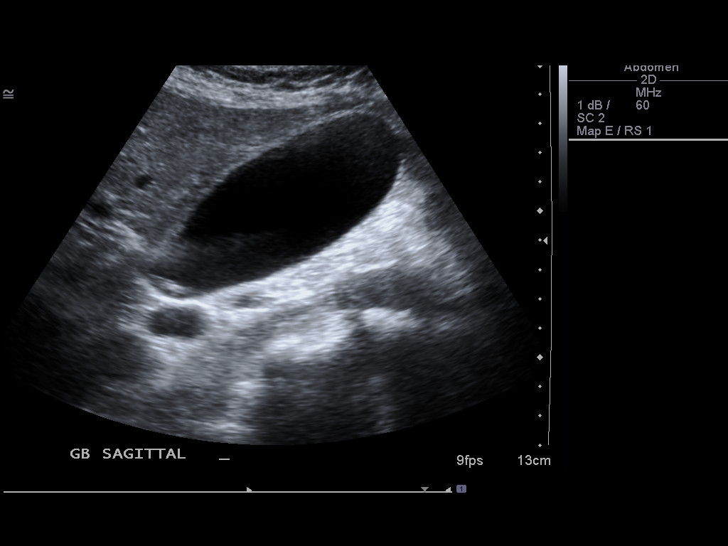
[im 57/86]
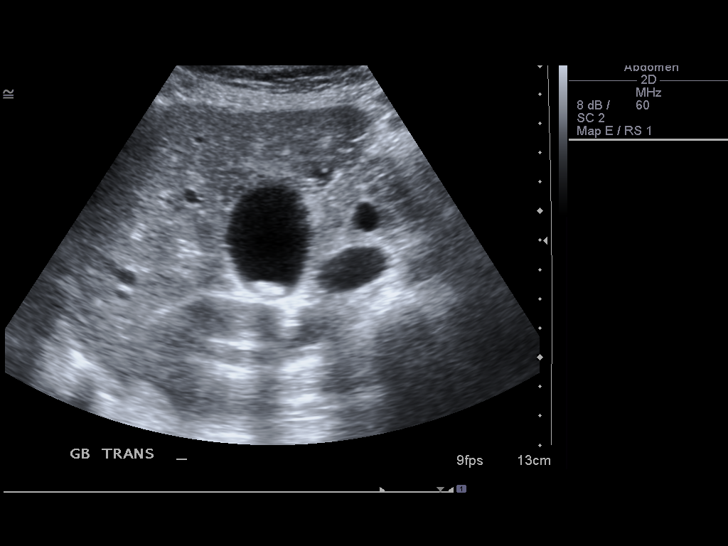
[im 64/86]
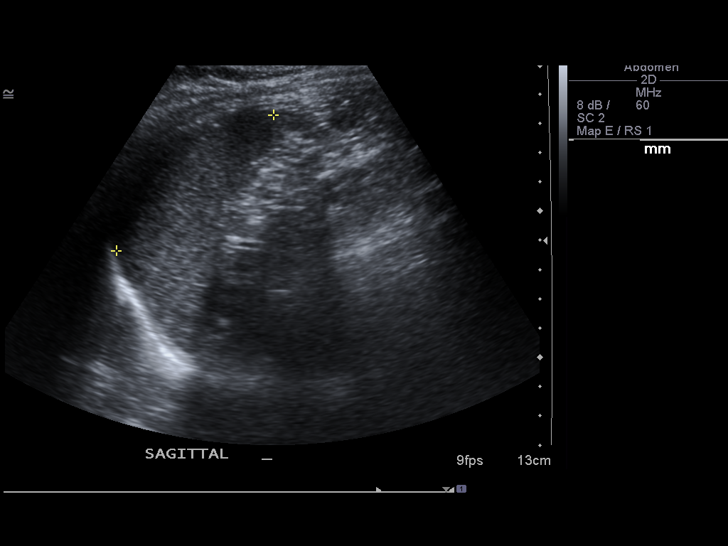
[im 71/86]
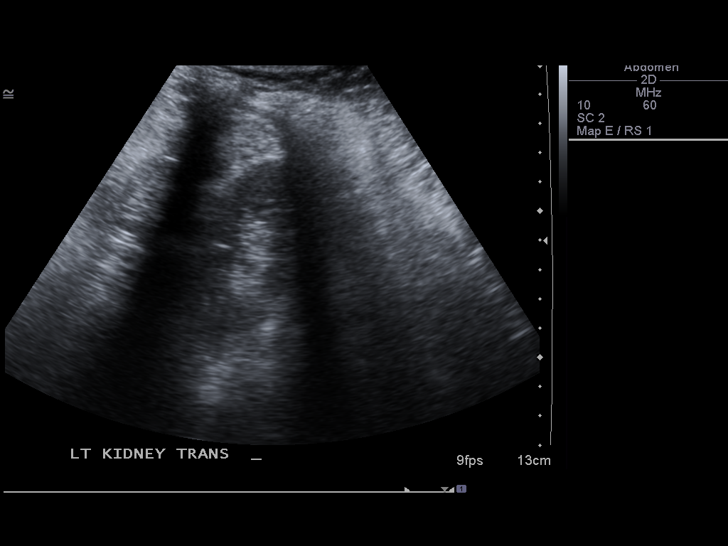
[im 78/86]
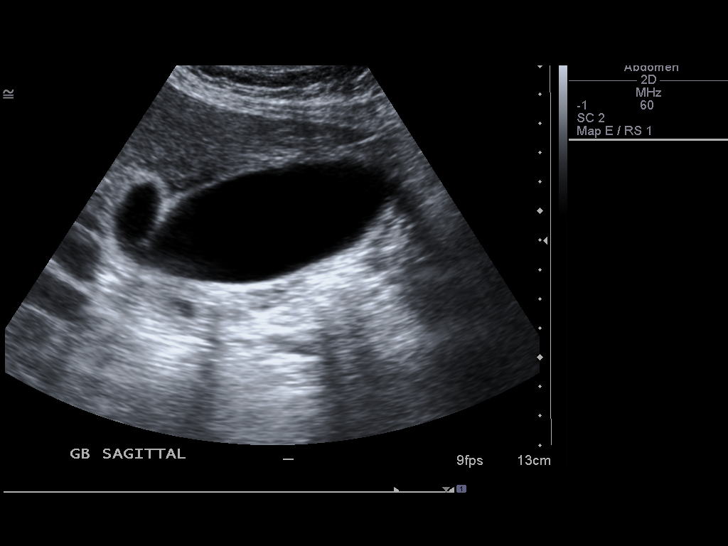
[im 86/86]
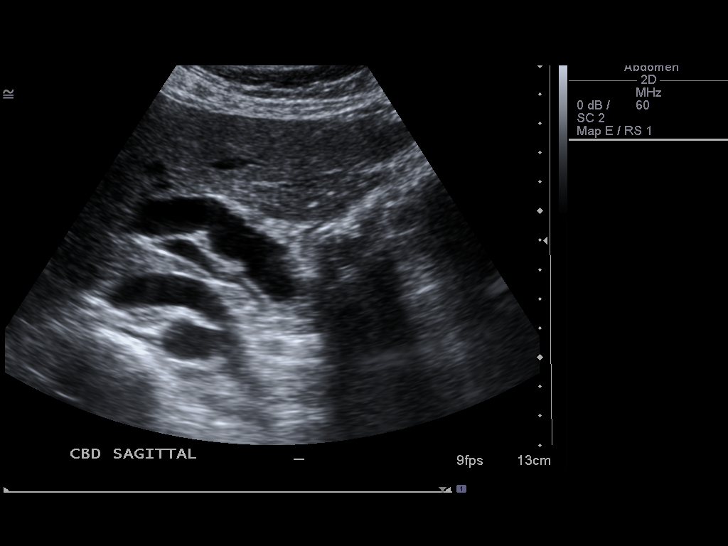

[13 of 25 positions shown; findings below may reference images not displayed]

FINDINGS: Gallbladder:The gallbladder is distended and there is mild
pericholecystic fluid and wall thickening to 6.2 mm.  There is no
specific sonographic Murphy's sign.  Several gallstones are
present.

Common bile duct:   Moderately dilated to 14.6 mm.  There is also
intra hepatic biliary dilatation. No common duct stones are
visualized.

Liver:  Unremarkable in echogenicity and contour without focal
abnormality.

IVC: Visualized portions unremarkable.

Pancreas:  Visualized portions unremarkable.  No mass or pancreatic
ductal dilatation is identified.

Spleen:  Visualized portions unremarkable.

Right Kidney:  The renal cortical thickness and echogenicity are
preserved.  There is no hydronephrosis or focal abnormality. Renal
length is 10.7 cm.

Left Kidney:  The renal cortical thickness and echogenicity are
preserved.  There is no hydronephrosis or focal abnormality.  Renal
length is35.3 cm.

Abdominal aorta:  Visualized portions unremarkable.
IMPRESSION: 1.  Significant intra and extrahepatic biliary dilatation of
undetermined etiology.  Given the presence of gallstones and the
patient's young age, choledocholithiasis is a definite
consideration. Less likely possibilities would include a distal
biliary stricture and mass.  Correlation with liver function tests
is recommended.  MRCP may be helpful for further evaluation.
2.  Gallstones, mild gallbladder wall thickening and
pericholecystic fluid without sonographic Murphy's sign; early
acute or mild chronic cholecystitis cannot be excluded.
3.  No other significant abdominal findings.

Critical test results telephoned to Dr. Ankaara at the time of
interpretation on 02/04/2009 at 7984 hours.

## 2011-02-10 LAB — DIFFERENTIAL
Basophils Absolute: 0
Lymphocytes Relative: 16
Lymphs Abs: 3
Monocytes Absolute: 1.8 — ABNORMAL HIGH
Monocytes Relative: 10
Neutro Abs: 13.6 — ABNORMAL HIGH

## 2011-02-10 LAB — CBC
Hemoglobin: 10.6 — ABNORMAL LOW
Hemoglobin: 12.5
MCHC: 35.1
RBC: 3.73 — ABNORMAL LOW
RDW: 13.2
RDW: 13.3
WBC: 18.9 — ABNORMAL HIGH

## 2011-02-10 LAB — RAPID HIV SCREEN (WH-MAU): Rapid HIV Screen: NONREACTIVE

## 2011-02-10 LAB — RUBELLA SCREEN: Rubella: 102.5 — ABNORMAL HIGH

## 2011-02-10 LAB — TYPE AND SCREEN: Antibody Screen: NEGATIVE

## 2011-10-26 ENCOUNTER — Emergency Department (HOSPITAL_COMMUNITY)
Admission: EM | Admit: 2011-10-26 | Discharge: 2011-10-26 | Disposition: A | Discharge status: Home / Self Care | Payer: Medicaid Other | Attending: Emergency Medicine | Admitting: Emergency Medicine

## 2011-10-26 ENCOUNTER — Encounter (HOSPITAL_COMMUNITY): Payer: Self-pay

## 2011-10-26 DIAGNOSIS — H9209 Otalgia, unspecified ear: Secondary | ICD-10-CM | POA: Insufficient documentation

## 2011-10-26 DIAGNOSIS — N390 Urinary tract infection, site not specified: Secondary | ICD-10-CM

## 2011-10-26 DIAGNOSIS — R51 Headache: Secondary | ICD-10-CM | POA: Insufficient documentation

## 2011-10-26 DIAGNOSIS — R509 Fever, unspecified: Secondary | ICD-10-CM | POA: Insufficient documentation

## 2011-10-26 DIAGNOSIS — M549 Dorsalgia, unspecified: Secondary | ICD-10-CM | POA: Insufficient documentation

## 2011-10-26 LAB — URINALYSIS, ROUTINE W REFLEX MICROSCOPIC
Specific Gravity, Urine: 1.015 (ref 1.005–1.030)
Urobilinogen, UA: >8 mg/dL — ABNORMAL HIGH (ref 0.0–1.0)
pH: 6 (ref 5.0–8.0)

## 2011-10-26 LAB — URINE MICROSCOPIC-ADD ON

## 2011-10-26 LAB — PREGNANCY, URINE: Preg Test, Ur: NEGATIVE

## 2011-10-26 MED ORDER — CEPHALEXIN 250 MG PO CAPS
500.0000 mg | ORAL_CAPSULE | Freq: Once | ORAL | Status: AC
  Administered 2011-10-26: 500 mg via ORAL
  Filled 2011-10-26: qty 2

## 2011-10-26 MED ORDER — CEPHALEXIN 500 MG PO CAPS: 500.0000 mg | ORAL_CAPSULE | Freq: Four times a day (QID) | ORAL | Status: AC

## 2011-10-26 MED ORDER — KETOROLAC TROMETHAMINE 60 MG/2ML IM SOLN
60.0000 mg | Freq: Once | INTRAMUSCULAR | Status: AC
  Administered 2011-10-26: 60 mg via INTRAMUSCULAR
  Filled 2011-10-26: qty 2

## 2011-10-26 MED ORDER — TRAMADOL HCL 50 MG PO TABS: 50.0000 mg | ORAL_TABLET | Freq: Four times a day (QID) | ORAL | Status: AC | PRN

## 2011-10-26 NOTE — Discharge Instructions (Signed)
Drink plenty of fluids.  Follow up when the antibiotic is finished

## 2011-10-26 NOTE — ED Notes (Signed)
Pt complains of headache and back pain, ongoing, andear ache onset yesterday.

## 2011-10-26 NOTE — ED Provider Notes (Signed)
History   This chart was scribed for Benny Lennert, MD by Sofie Rower. The patient was seen in room TR08C/TR08C and the patient's care was started at 11:39 AM    CSN: [id number]  Arrival date & time 10/26/11  1058   None     Chief Complaint  Patient presents with  . Headache  . Back Pain  . Otalgia    (Consider location/radiation/quality/duration/timing/severity/associated sxs/prior treatment) Patient is a 26 y.o. female presenting with headaches and back pain. The history is provided by the patient. No language interpreter was used.  Headache  The current episode started more than 2 days ago. The problem occurs constantly. The problem has not changed since onset.The headache is associated with nothing. The quality of the pain is described as dull. The pain is moderate. The pain does not radiate. Associated symptoms include a fever (100.1). She has tried acetaminophen (tylenol extra strength) for the symptoms. The treatment provided no relief.  Back Pain  This is a new problem. The current episode started more than 2 days ago. The problem occurs constantly. The problem has not changed since onset.The pain is associated with no known injury. The pain is present in the lumbar spine. The quality of the pain is described as aching. The pain does not radiate. The pain is moderate. The pain is the same all the time. Associated symptoms include a fever (100.1) and headaches. Pertinent negatives include no chest pain and no abdominal pain. She has tried nothing for the symptoms. The treatment provided no relief.      History  Substance Use Topics  . Smoking status: Current Everyday Smoker  . Smokeless tobacco: Not on file  . Alcohol Use: No    OB History    Grav Para Term Preterm Abortions TAB SAB Ect Mult Living                  Review of Systems  Constitutional: Positive for fever (100.1). Negative for fatigue.  HENT: Negative for congestion, sinus pressure and ear discharge.     Eyes: Negative for discharge.  Respiratory: Negative for cough.   Cardiovascular: Negative for chest pain.  Gastrointestinal: Negative for abdominal pain and diarrhea.  Genitourinary: Negative for frequency and hematuria.  Musculoskeletal: Negative for back pain.  Skin: Negative for rash.  Neurological: Positive for headaches. Negative for seizures.  Hematological: Negative.   Psychiatric/Behavioral: Negative for hallucinations.    Allergies  Review of patient's allergies indicates no known allergies.  Home Medications   Current Outpatient Rx  Name Route Sig Dispense Refill  . ACETAMINOPHEN 500 MG PO TABS Oral Take 2,000 mg by mouth every 6 (six) hours as needed. For pain      BP 119/69  Pulse 106  Temp 100.1 F (37.8 C) (Oral)  Resp 16  SpO2 99%  Physical Exam  Nursing note and vitals reviewed. Constitutional: She is oriented to person, place, and time. She appears well-developed and well-nourished.  HENT:  Head: Normocephalic.  Eyes: Conjunctivae are normal.  Neck: No tracheal deviation present.  Cardiovascular: Normal rate and normal heart sounds.   No murmur heard. Pulmonary/Chest: Effort normal and breath sounds normal.  Abdominal: Soft. There is tenderness.  Musculoskeletal: Normal range of motion. She exhibits tenderness (mild tenderness to both flanks.).  Neurological: She is oriented to person, place, and time.  Skin: Skin is warm.  Psychiatric: She has a normal mood and affect.    ED Course  Procedures (including critical care time)  DIAGNOSTIC STUDIES: Oxygen Saturation is 99% on room air, normal by my interpretation.    COORDINATION OF CARE:  11:38PM- EDP at bedside discusses treatment plan concerning urine sample.    Results for orders placed during the hospital encounter of 10/26/11  URINALYSIS, ROUTINE W REFLEX MICROSCOPIC      Component Value Range   Color, Urine AMBER (*) YELLOW   APPearance CLOUDY (*) CLEAR   Specific Gravity, Urine  1.015  1.005 - 1.030   pH 6.0  5.0 - 8.0   Glucose, UA NEGATIVE  NEGATIVE mg/dL   Hgb urine dipstick MODERATE (*) NEGATIVE   Bilirubin Urine SMALL (*) NEGATIVE   Ketones, ur NEGATIVE  NEGATIVE mg/dL   Protein, ur 161 (*) NEGATIVE mg/dL   Urobilinogen, UA >0.9 (*) 0.0 - 1.0 mg/dL   Nitrite POSITIVE (*) NEGATIVE   Leukocytes, UA MODERATE (*) NEGATIVE  PREGNANCY, URINE      Component Value Range   Preg Test, Ur NEGATIVE  NEGATIVE  URINE MICROSCOPIC-ADD ON      Component Value Range   Squamous Epithelial / LPF RARE  RARE   WBC, UA TOO NUMEROUS TO COUNT  <3 WBC/hpf   RBC / HPF 0-2  <3 RBC/hpf   Bacteria, UA RARE  RARE   Urine-Other MUCOUS PRESENT      No results found.   No diagnosis found.    MDM    The chart was scribed for me under my direct supervision.  I personally performed the history, physical, and medical decision making and all procedures in the evaluation of this patient.Benny Lennert, MD 10/26/11 1230

## 2013-08-23 ENCOUNTER — Encounter (HOSPITAL_COMMUNITY): Payer: Self-pay | Admitting: Emergency Medicine

## 2013-08-23 ENCOUNTER — Emergency Department (INDEPENDENT_AMBULATORY_CARE_PROVIDER_SITE_OTHER)
Admission: EM | Admit: 2013-08-23 | Discharge: 2013-08-23 | Disposition: A | Discharge status: Home / Self Care | Payer: Self-pay | Source: Home / Self Care

## 2013-08-23 DIAGNOSIS — S335XXA Sprain of ligaments of lumbar spine, initial encounter: Secondary | ICD-10-CM

## 2013-08-23 DIAGNOSIS — S39012A Strain of muscle, fascia and tendon of lower back, initial encounter: Secondary | ICD-10-CM

## 2013-08-23 MED ORDER — CYCLOBENZAPRINE HCL 5 MG PO TABS: 5.0000 mg | ORAL_TABLET | Freq: Three times a day (TID) | ORAL | Status: DC | PRN

## 2013-08-23 MED ORDER — DICLOFENAC POTASSIUM 50 MG PO TABS: 50.0000 mg | ORAL_TABLET | Freq: Three times a day (TID) | ORAL | Status: DC

## 2013-08-23 NOTE — ED Notes (Signed)
Pt c/o lower back pain onset 3 days Reports she has recently started working 14-16 hours per day Denies fevers, abd pain, urinary sx Alert w/no signs of acute distress.

## 2013-08-23 NOTE — Discharge Instructions (Signed)
Heat, medicines and exercises to help back. See orthopedist if further problems.

## 2013-08-23 NOTE — ED Provider Notes (Signed)
CSN: [id number]     Arrival date & time 08/23/13  1218 History   None    Chief Complaint  Patient presents with  . Back Pain   (Consider location/radiation/quality/duration/timing/severity/associated sxs/prior Treatment) Patient is a 28 y.o. female presenting with back pain. The history is provided by the patient.  Back Pain Location:  Lumbar spine Quality:  Stiffness Radiates to:  Does not radiate Pain severity:  Mild Onset quality:  Gradual Duration:  3 days Progression:  Worsening Chronicity:  New Context: lifting heavy objects   Context comment:  Just started working 14-16 hr shifts recently, has good mattress. no gi or gu sx, no neuro sx., standing all the time[facility]   History reviewed. No pertinent past medical history. History reviewed. No pertinent past surgical history. No family history on file. History  Substance Use Topics  . Smoking status: Current Every Day Smoker -- 1.00 packs/day    Types: Cigarettes  . Smokeless tobacco: Not on file  . Alcohol Use: No   OB History   Grav Para Term Preterm Abortions TAB SAB Ect Mult Living                 Review of Systems  Constitutional: Negative.   Gastrointestinal: Negative.   Genitourinary: Negative.   Musculoskeletal: Positive for back pain. Negative for gait problem and joint swelling.  Skin: Negative.   Neurological: Negative.     Allergies  Review of patient's allergies indicates no known allergies.  Home Medications   Prior to Admission medications   Medication Sig Start Date End Date Taking? Authorizing Provider  acetaminophen (TYLENOL) 500 MG tablet Take 2,000 mg by mouth every 6 (six) hours as needed. For pain    Historical Provider, MD   BP 162/98  Pulse 54  Temp(Src) 99.1 F (37.3 C) (Oral)  Resp 16  SpO2 100%  LMP 08/23/2013 Physical Exam  Nursing note and vitals reviewed. Constitutional: She is oriented to person, place, and time. She appears well-developed  and well-nourished. No distress.  Abdominal: Soft. Bowel sounds are normal.  Musculoskeletal: She exhibits tenderness.       Lumbar back: She exhibits tenderness and spasm. She exhibits normal range of motion, no bony tenderness, no swelling, no deformity, no pain and normal pulse.  Neurological: She is alert and oriented to person, place, and time.  Skin: Skin is warm and dry.    ED Course  Procedures (including critical care time) Labs Review Labs Reviewed - No data to display  Imaging Review No results found.   MDM   1. Repetitive strain injury of lower back        Linna HoffJames D Kassidie Hendriks, MD 08/23/13 1439

## 2013-10-24 ENCOUNTER — Emergency Department (HOSPITAL_COMMUNITY)
Admission: EM | Admit: 2013-10-24 | Discharge: 2013-10-24 | Disposition: A | Discharge status: Home / Self Care | Payer: Medicaid Other | Attending: Emergency Medicine | Admitting: Emergency Medicine

## 2013-10-24 ENCOUNTER — Encounter (HOSPITAL_COMMUNITY): Payer: Self-pay | Admitting: Emergency Medicine

## 2013-10-24 DIAGNOSIS — R1032 Left lower quadrant pain: Secondary | ICD-10-CM

## 2013-10-24 DIAGNOSIS — F17200 Nicotine dependence, unspecified, uncomplicated: Secondary | ICD-10-CM | POA: Insufficient documentation

## 2013-10-24 DIAGNOSIS — N3000 Acute cystitis without hematuria: Secondary | ICD-10-CM | POA: Insufficient documentation

## 2013-10-24 LAB — COMPREHENSIVE METABOLIC PANEL
ALK PHOS: 76 U/L (ref 39–117)
ALT: 12 U/L (ref 0–35)
AST: 10 U/L (ref 0–37)
Albumin: 3.5 g/dL (ref 3.5–5.2)
BILIRUBIN TOTAL: 0.4 mg/dL (ref 0.3–1.2)
BUN: 5 mg/dL — ABNORMAL LOW (ref 6–23)
CHLORIDE: 99 meq/L (ref 96–112)
CO2: 24 meq/L (ref 19–32)
Calcium: 9.1 mg/dL (ref 8.4–10.5)
Creatinine, Ser: 0.76 mg/dL (ref 0.50–1.10)
GLUCOSE: 90 mg/dL (ref 70–99)
POTASSIUM: 3.2 meq/L — AB (ref 3.7–5.3)
SODIUM: 137 meq/L (ref 137–147)
Total Protein: 7.3 g/dL (ref 6.0–8.3)

## 2013-10-24 LAB — URINALYSIS, ROUTINE W REFLEX MICROSCOPIC
Bilirubin Urine: NEGATIVE
GLUCOSE, UA: NEGATIVE mg/dL
KETONES UR: 15 mg/dL — AB
Nitrite: POSITIVE — AB
PROTEIN: 30 mg/dL — AB
Specific Gravity, Urine: 1.017 (ref 1.005–1.030)
Urobilinogen, UA: 2 mg/dL — ABNORMAL HIGH (ref 0.0–1.0)
pH: 6 (ref 5.0–8.0)

## 2013-10-24 LAB — CBC WITH DIFFERENTIAL/PLATELET
Basophils Absolute: 0 10*3/uL (ref 0.0–0.1)
Basophils Relative: 0 % (ref 0–1)
EOS PCT: 0 % (ref 0–5)
Eosinophils Absolute: 0 10*3/uL (ref 0.0–0.7)
HCT: 36.9 % (ref 36.0–46.0)
Hemoglobin: 12.4 g/dL (ref 12.0–15.0)
LYMPHS ABS: 2.1 10*3/uL (ref 0.7–4.0)
LYMPHS PCT: 16 % (ref 12–46)
MCH: 32.5 pg (ref 26.0–34.0)
MCHC: 33.6 g/dL (ref 30.0–36.0)
MCV: 96.9 fL (ref 78.0–100.0)
Monocytes Absolute: 1.2 10*3/uL — ABNORMAL HIGH (ref 0.1–1.0)
Monocytes Relative: 9 % (ref 3–12)
Neutro Abs: 10.2 10*3/uL — ABNORMAL HIGH (ref 1.7–7.7)
Neutrophils Relative %: 75 % (ref 43–77)
PLATELETS: 164 10*3/uL (ref 150–400)
RBC: 3.81 MIL/uL — AB (ref 3.87–5.11)
RDW: 12.1 % (ref 11.5–15.5)
WBC: 13.6 10*3/uL — AB (ref 4.0–10.5)

## 2013-10-24 LAB — PREGNANCY, URINE: Preg Test, Ur: NEGATIVE

## 2013-10-24 LAB — URINE MICROSCOPIC-ADD ON

## 2013-10-24 LAB — LIPASE, BLOOD: Lipase: 12 U/L (ref 11–59)

## 2013-10-24 MED ORDER — ONDANSETRON 4 MG PO TBDP
8.0000 mg | ORAL_TABLET | Freq: Once | ORAL | Status: AC
  Administered 2013-10-24: 8 mg via ORAL
  Filled 2013-10-24: qty 2

## 2013-10-24 MED ORDER — OXYCODONE-ACETAMINOPHEN 5-325 MG PO TABS: 1.0000 | ORAL_TABLET | ORAL | Status: DC | PRN

## 2013-10-24 MED ORDER — OXYCODONE-ACETAMINOPHEN 5-325 MG PO TABS
1.0000 | ORAL_TABLET | Freq: Once | ORAL | Status: DC
  Filled 2013-10-24: qty 1

## 2013-10-24 MED ORDER — OXYCODONE-ACETAMINOPHEN 5-325 MG PO TABS
1.0000 | ORAL_TABLET | Freq: Once | ORAL | Status: AC
  Administered 2013-10-24: 1 via ORAL

## 2013-10-24 MED ORDER — CEPHALEXIN 500 MG PO CAPS: 500.0000 mg | ORAL_CAPSULE | Freq: Four times a day (QID) | ORAL | Status: DC

## 2013-10-24 MED ORDER — CEPHALEXIN 250 MG PO CAPS
500.0000 mg | ORAL_CAPSULE | Freq: Once | ORAL | Status: AC
  Administered 2013-10-24: 500 mg via ORAL
  Filled 2013-10-24: qty 2

## 2013-10-24 NOTE — Discharge Instructions (Signed)
Urinary Tract Infection °Urinary tract infections (UTIs) can develop anywhere along your urinary tract. Your urinary tract is your body's drainage system for removing wastes and extra water. Your urinary tract includes two kidneys, two ureters, a bladder, and a urethra. Your kidneys are a pair of bean-shaped organs. Each kidney is about the size of your fist. They are located below your ribs, one on each side of your spine. °CAUSES °Infections are caused by microbes, which are microscopic organisms, including fungi, viruses, and bacteria. These organisms are so small that they can only be seen through a microscope. Bacteria are the microbes that most commonly cause UTIs. °SYMPTOMS  °Symptoms of UTIs may vary by age and gender of the patient and by the location of the infection. Symptoms in young women typically include a frequent and intense urge to urinate and a painful, burning feeling in the bladder or urethra during urination. Older women and men are more likely to be tired, shaky, and weak and have muscle aches and abdominal pain. A fever may mean the infection is in your kidneys. Other symptoms of a kidney infection include pain in your back or sides below the ribs, nausea, and vomiting. °DIAGNOSIS °To diagnose a UTI, your caregiver will ask you about your symptoms. Your caregiver also will ask to provide a urine sample. The urine sample will be tested for bacteria and white blood cells. White blood cells are made by your body to help fight infection. °TREATMENT  °Typically, UTIs can be treated with medication. Because most UTIs are caused by a bacterial infection, they usually can be treated with the use of antibiotics. The choice of antibiotic and length of treatment depend on your symptoms and the type of bacteria causing your infection. °HOME CARE INSTRUCTIONS °· If you were prescribed antibiotics, take them exactly as your caregiver instructs you. Finish the medication even if you feel better after you  have only taken some of the medication. °· Drink enough water and fluids to keep your urine clear or pale yellow. °· Avoid caffeine, tea, and carbonated beverages. They tend to irritate your bladder. °· Empty your bladder often. Avoid holding urine for long periods of time. °· Empty your bladder before and after sexual intercourse. °· After a bowel movement, women should cleanse from front to back. Use each tissue only once. °SEEK MEDICAL CARE IF:  °· You have back pain. °· You develop a fever. °· Your symptoms do not begin to resolve within 3 days. °SEEK IMMEDIATE MEDICAL CARE IF:  °· You have severe back pain or lower abdominal pain. °· You develop chills. °· You have nausea or vomiting. °· You have continued burning or discomfort with urination. °MAKE SURE YOU:  °· Understand these instructions. °· Will watch your condition. °· Will get help right away if you are not doing well or get worse. °Document Released: 01/25/2005 Document Revised: 10/17/2011 Document Reviewed: 05/26/2011 °[employer]® Patient Information ©2015 [employer]. This information is not intended to replace advice given to you by your health care provider. Make sure you discuss any questions you have with your health care provider. ° °Abdominal Pain °Many things can cause abdominal pain. Usually, abdominal pain is not caused by a disease and will improve without treatment. It can often be observed and treated at home. Your health care provider will do a physical exam and possibly order blood tests and X-rays to help determine the seriousness of your pain. However, in many cases, more time must pass before a clear   cause of the pain can be found. Before that point, your health care provider may not know if you need more testing or further treatment. °HOME CARE INSTRUCTIONS  °Monitor your abdominal pain for any changes. The following actions may help to alleviate any discomfort you are experiencing: °· Only take over-the-counter or prescription  medicines as directed by your health care provider. °· Do not take laxatives unless directed to do so by your health care provider. °· Try a clear liquid diet (broth, tea, or water) as directed by your health care provider. Slowly move to a bland diet as tolerated. °SEEK MEDICAL CARE IF: °· You have unexplained abdominal pain. °· You have abdominal pain associated with nausea or diarrhea. °· You have pain when you urinate or have a bowel movement. °· You experience abdominal pain that wakes you in the night. °· You have abdominal pain that is worsened or improved by eating food. °· You have abdominal pain that is worsened with eating fatty foods. °· You have a fever. °SEEK IMMEDIATE MEDICAL CARE IF:  °· Your pain does not go away within 2 hours. °· You keep throwing up (vomiting). °· Your pain is felt only in portions of the abdomen, such as the right side or the left lower portion of the abdomen. °· You pass bloody or black tarry stools. °MAKE SURE YOU: °· Understand these instructions.   °· Will watch your condition.   °· Will get help right away if you are not doing well or get worse.   °Document Released: 01/25/2005 Document Revised: 04/22/2013 Document Reviewed: 12/25/2012 °[employer]® Patient Information ©2015 [employer]. This information is not intended to replace advice given to you by your health care provider. Make sure you discuss any questions you have with your health care provider. ° °

## 2013-10-24 NOTE — ED Notes (Signed)
Pt. reports LLQ pain with nausea , body aches and headache onset Wednesday , denies fever or chills , no urinary discomfort .

## 2013-10-24 NOTE — ED Provider Notes (Signed)
CSN: [id number]     Arrival date & time 10/24/13  1904 History   First MD Initiated Contact with Patient 10/24/13 2224     Chief Complaint  Patient presents with  . Abdominal Pain     (Consider location/radiation/quality/duration/timing/severity/associated sxs/prior Treatment) Patient is a 28 y.o. female presenting with abdominal pain. The history is provided by the patient.  Abdominal Pain  Theresa Avila is a 28 y.o. female she complains of left lower quadrant abdominal pain with urinary frequency, but no dysuria, for several days. She denies fever, chills, weakness, or dizziness. She has been anorexic today. She denies recent change in bowel movements. She denies vaginal discharge or bleeding. She is status post bilateral tubal ligation. She has not injured herself. Her pain in her abdomen is worse with walking. No other known modifying factors.   History reviewed. No pertinent past medical history. Past Surgical History  Procedure Laterality Date  . Tubal ligation     No family history on file. History  Substance Use Topics  . Smoking status: Current Every Day Smoker -- 1.00 packs/day    Types: Cigarettes  . Smokeless tobacco: Not on file  . Alcohol Use: No   OB History   Grav Para Term Preterm Abortions TAB SAB Ect Mult Living                 Review of Systems  Gastrointestinal: Positive for abdominal pain.  All other systems reviewed and are negative.     Allergies  Review of patient's allergies indicates no known allergies.  Home Medications   Prior to Admission medications   Not on File   BP 144/82  Pulse 87  Temp(Src) 98.4 F (36.9 C) (Oral)  Resp 16  Ht 5\' 3"  (1.6 m)  Wt 117 lb (53.071 kg)  BMI 20.73 kg/m2  SpO2 99%  LMP 10/09/2013 Physical Exam  Nursing note and vitals reviewed. Constitutional: She is oriented to person, place, and time. She appears well-developed and well-nourished.  HENT:  Head: Normocephalic and atraumatic.  Eyes:  Conjunctivae and EOM are normal. Pupils are equal, round, and reactive to light.  Neck: Normal range of motion and phonation normal. Neck supple.  Cardiovascular: Normal rate, regular rhythm and intact distal pulses.   Pulmonary/Chest: Effort normal and breath sounds normal. She exhibits no tenderness.  Abdominal: Soft. She exhibits no distension. There is tenderness (left lower quadrant, mild). There is no guarding.  Genitourinary:  No costovertebral angle tenderness to percussion  Musculoskeletal: Normal range of motion.  Neurological: She is alert and oriented to person, place, and time. She exhibits normal muscle tone.  Skin: Skin is warm and dry.  Psychiatric: She has a normal mood and affect. Her behavior is normal. Judgment and thought content normal.    ED Course  Procedures (including critical care time) Medications  ondansetron (ZOFRAN-ODT) disintegrating tablet 8 mg (8 mg Oral Given 10/24/13 2238)  oxyCODONE-acetaminophen (PERCOCET/ROXICET) 5-325 MG per tablet 1 tablet (1 tablet Oral Given 10/24/13 2238)  cephALEXin (KEFLEX) capsule 500 mg (500 mg Oral Given 10/24/13 2238)    Patient Vitals for the past 24 hrs:  BP Temp Temp src Pulse Resp SpO2 Height Weight  10/24/13 2230 133/85 mmHg - - 71 - 100 % - -  10/24/13 1912 144/82 mmHg 98.4 F (36.9 C) Oral 87 16 99 % 5\' 3"  (1.6 m) 117 lb (53.071 kg)      Labs Review Labs Reviewed  CBC WITH DIFFERENTIAL - Abnormal; Notable for the  following:    WBC 13.6 (*)    RBC 3.81 (*)    Neutro Abs 10.2 (*)    Monocytes Absolute 1.2 (*)    All other components within normal limits  COMPREHENSIVE METABOLIC PANEL - Abnormal; Notable for the following:    Potassium 3.2 (*)    BUN 5 (*)    All other components within normal limits  URINALYSIS, ROUTINE W REFLEX MICROSCOPIC - Abnormal; Notable for the following:    Color, Urine AMBER (*)    APPearance CLOUDY (*)    Hgb urine dipstick MODERATE (*)    Ketones, ur 15 (*)    Protein, ur 30  (*)    Urobilinogen, UA 2.0 (*)    Nitrite POSITIVE (*)    Leukocytes, UA LARGE (*)    All other components within normal limits  URINE MICROSCOPIC-ADD ON - Abnormal; Notable for the following:    Squamous Epithelial / LPF MANY (*)    Bacteria, UA MANY (*)    All other components within normal limits  LIPASE, BLOOD  PREGNANCY, URINE    Imaging Review No results found.   EKG Interpretation None      MDM   Final diagnoses:  Acute cystitis without hematuria  Left lower quadrant pain    Evaluation is consistent with urinary tract infection. Doubt colitis, serious bacterial infection or metabolic instability   Nursing Notes Reviewed/ Care Coordinated Applicable Imaging Reviewed Interpretation of Laboratory Data incorporated into ED treatment  The patient appears reasonably screened and/or stabilized for discharge and I doubt any other medical condition or other [facility] requiring further screening, evaluation, or treatment in the ED at this time prior to discharge.  Plan: Home Medications- Keflex, Percocet; Home Treatments- rest, fluids; return here if the recommended treatment, does not improve the symptoms; Recommended follow up- PCP prn    Flint MelterElliott L Wentz, MD 10/25/13 0001

## 2013-10-27 LAB — URINE CULTURE: Colony Count: >=100000

## 2013-11-01 ENCOUNTER — Telehealth (HOSPITAL_BASED_OUTPATIENT_CLINIC_OR_DEPARTMENT_OTHER): Payer: Self-pay | Admitting: Emergency Medicine

## 2013-11-01 NOTE — Telephone Encounter (Signed)
Post ED Visit - Positive Culture Follow-up  Culture report reviewed by antimicrobial stewardship pharmacist: []  Theresa Avila, Pharm.D., BCPS []  Theresa Avila, Pharm.D., BCPS [x]  Theresa Avila, [address] BCPS []  Theresa Avila, [address]Ph[address] BCPS, AAHIVP []  Theresa Avila, Pharm.D., BCPS, AAHIVP  Positive urine culture Treated with Keflex, organism sensitive to the same and no further patient follow-up is required at this time.  [place], [name] 11/01/2013, 10:28 AM

## 2015-01-29 ENCOUNTER — Encounter (HOSPITAL_COMMUNITY): Payer: Self-pay

## 2015-01-29 ENCOUNTER — Emergency Department (HOSPITAL_COMMUNITY)
Admission: EM | Admit: 2015-01-29 | Discharge: 2015-01-29 | Disposition: A | Discharge status: Home / Self Care | Payer: Medicaid Other | Attending: Emergency Medicine | Admitting: Emergency Medicine

## 2015-01-29 DIAGNOSIS — M545 Low back pain: Secondary | ICD-10-CM

## 2015-01-29 DIAGNOSIS — Z87898 Personal history of other specified conditions: Secondary | ICD-10-CM

## 2015-01-29 DIAGNOSIS — R51 Headache: Secondary | ICD-10-CM | POA: Insufficient documentation

## 2015-01-29 DIAGNOSIS — Z720 Tobacco use: Secondary | ICD-10-CM | POA: Insufficient documentation

## 2015-01-29 DIAGNOSIS — R112 Nausea with vomiting, unspecified: Secondary | ICD-10-CM | POA: Diagnosis not present

## 2015-01-29 MED ORDER — IBUPROFEN 600 MG PO TABS: 600.0000 mg | ORAL_TABLET | Freq: Four times a day (QID) | ORAL | Status: DC | PRN

## 2015-01-29 MED ORDER — OXYCODONE-ACETAMINOPHEN 5-325 MG PO TABS
1.0000 | ORAL_TABLET | Freq: Once | ORAL | Status: AC
  Administered 2015-01-29: 1 via ORAL

## 2015-01-29 MED ORDER — METHOCARBAMOL 500 MG PO TABS: 500.0000 mg | ORAL_TABLET | Freq: Four times a day (QID) | ORAL | Status: DC | PRN

## 2015-01-29 MED ORDER — OXYCODONE-ACETAMINOPHEN 5-325 MG PO TABS
ORAL_TABLET | ORAL | Status: AC
  Filled 2015-01-29: qty 1

## 2015-01-29 NOTE — Discharge Instructions (Signed)
Read the information below.  Use the prescribed medication as directed.  Please discuss all new medications with your pharmacist.  You may return to the Emergency Department at any time for worsening condition or any new symptoms that concern you.   If you develop fevers, loss of control of bowel or bladder, weakness or numbness in your legs, or are unable to walk, return to the ER for a recheck.   You are having a headache. No specific cause was found today for your headache. It may have been a migraine or other cause of headache. Stress, anxiety, fatigue, and depression are common triggers for headaches. Your headache today does not appear to be life-threatening or require hospitalization, but often the exact cause of headaches is not determined in the emergency department. Therefore, follow-up with your doctor is very important to find out what may have caused your headache, and whether or not you need any further diagnostic testing or treatment. Sometimes headaches can appear benign (not harmful), but then more serious symptoms can develop which should prompt an immediate re-evaluation by your doctor or the emergency department. SEEK MEDICAL ATTENTION IF: You develop possible problems with medications prescribed.  The medications don't resolve your headache, if it recurs , or if you have multiple episodes of vomiting or can't take fluids. You have a change from the usual headache. RETURN IMMEDIATELY IF you develop a sudden, severe headache or confusion, become poorly responsive or faint, develop a fever above 100.25F or problem breathing, have a change in speech, vision, swallowing, or understanding, or develop new weakness, numbness, tingling, incoordination, or have a seizure.     Back Pain, Adult Low back pain is very common. About 1 in 5 people have back pain.The cause of low back pain is rarely dangerous. The pain often gets better over time.About half of people with a sudden onset of back pain  feel better in just 2 weeks. About 8 in 10 people feel better by 6 weeks.  CAUSES Some common causes of back pain include:  Strain of the muscles or ligaments supporting the spine.  Wear and tear (degeneration) of the spinal discs.  Arthritis.  Direct injury to the back. DIAGNOSIS Most of the time, the direct cause of low back pain is not known.However, back pain can be treated effectively even when the exact cause of the pain is unknown.Answering your caregiver's questions about your overall health and symptoms is one of the most accurate ways to make sure the cause of your pain is not dangerous. If your caregiver needs more information, he or she may order lab work or imaging tests (X-rays or MRIs).However, even if imaging tests show changes in your back, this usually does not require surgery. HOME CARE INSTRUCTIONS For many people, back pain returns.Since low back pain is rarely dangerous, it is often a condition that people can learn to [facility] their own.   Remain active. It is stressful on the back to sit or stand in one place. Do not sit, drive, or stand in one place for more than 30 minutes at a time. Take short walks on level surfaces as soon as pain allows.Try to increase the length of time you walk each day.  Do not stay in bed.Resting more than 1 or 2 days can delay your recovery.  Do not avoid exercise or work.Your body is made to move.It is not dangerous to be active, even though your back may hurt.Your back will likely heal faster if you return to  being active before your pain is gone.  Pay attention to your body when you bend and lift. Many people have less discomfortwhen lifting if they bend their knees, keep the load close to their bodies,and avoid twisting. Often, the most comfortable positions are those that put less stress on your recovering back.  Find a comfortable position to sleep. Use a firm mattress and lie on your side with your knees slightly bent. If  you lie on your back, put a pillow under your knees.  Only take over-the-counter or prescription medicines as directed by your caregiver. Over-the-counter medicines to reduce pain and inflammation are often the most helpful.Your caregiver may prescribe muscle relaxant drugs.These medicines help dull your pain so you can more quickly return to your normal activities and healthy exercise.  Put ice on the injured area.  Put ice in a plastic bag.  Place a towel between your skin and the bag.  Leave the ice on for 15-20 minutes, 03-04 times a day for the first 2 to 3 days. After that, ice and heat may be alternated to reduce pain and spasms.  Ask your caregiver about trying back exercises and gentle massage. This may be of some benefit.  Avoid feeling anxious or stressed.Stress increases muscle tension and can worsen back pain.It is important to recognize when you are anxious or stressed and learn ways to manage it.Exercise is a great option. SEEK MEDICAL CARE IF:  You have pain that is not relieved with rest or medicine.  You have pain that does not improve in 1 week.  You have new symptoms.  You are generally not feeling well. SEEK IMMEDIATE MEDICAL CARE IF:   You have pain that radiates from your back into your legs.  You develop new bowel or bladder control problems.  You have unusual weakness or numbness in your arms or legs.  You develop nausea or vomiting.  You develop abdominal pain.  You feel faint. Document Released: 04/17/2005 Document Revised: 10/17/2011 Document Reviewed: 08/19/2013 [facility] Patient Information 2015 [place], [place]. This information is not intended to replace advice given to you by your health care provider. Make sure you discuss any questions you have with your health care provider.  Back Exercises Back exercises help treat and prevent back injuries. The goal of back exercises is to increase the strength of your abdominal and back muscles and  the flexibility of your back. These exercises should be started when you no longer have back pain. Back exercises include:  Pelvic Tilt. Lie on your back with your knees bent. Tilt your pelvis until the lower part of your back is against the floor. Hold this position 5 to 10 sec and repeat 5 to 10 times.  Knee to Chest. Pull first 1 knee up against your chest and hold for 20 to 30 seconds, repeat this with the other knee, and then both knees. This may be done with the other leg straight or bent, whichever feels better.  Sit-Ups or Curl-Ups. Bend your knees 90 degrees. Start with tilting your pelvis, and do a partial, slow sit-up, lifting your trunk only 30 to 45 degrees off the floor. Take at least 2 to 3 seconds for each sit-up. Do not do sit-ups with your knees out straight. If partial sit-ups are difficult, simply do the above but with only tightening your abdominal muscles and holding it as directed.  Hip-Lift. Lie on your back with your knees flexed 90 degrees. Push down with your feet and shoulders as  you raise your hips a couple inches off the floor; hold for 10 seconds, repeat 5 to 10 times.  Back arches. Lie on your stomach, propping yourself up on bent elbows. Slowly press on your hands, causing an arch in your low back. Repeat 3 to 5 times. Any initial stiffness and discomfort should lessen with repetition over time.  Shoulder-Lifts. Lie face down with arms beside your body. Keep hips and torso pressed to floor as you slowly lift your head and shoulders off the floor. Do not overdo your exercises, especially in the beginning. Exercises may cause you some mild back discomfort which lasts for a few minutes; however, if the pain is more severe, or lasts for more than 15 minutes, do not continue exercises until you see your caregiver. Improvement with exercise therapy for back problems is slow.  See your caregivers for assistance with developing a proper back exercise program. Document  Released: 05/25/2004 Document Revised: 07/10/2011 Document Reviewed: 02/16/2011 [facility] Patient Information 2015 [place], [place]. This information is not intended to replace advice given to you by your health care provider. Make sure you discuss any questions you have with your health care provider.    Emergency Department Resource Guide 1) Find a Doctor and Pay Out of Pocket Although you won't have to find out who is covered by your insurance plan, it is a good idea to ask around and get recommendations. You will then need to call the office and see if the doctor you have chosen will accept you as a new patient and what types of options they offer for patients who are self-pay. Some doctors offer discounts or will set up payment plans for their patients who do not have insurance, but you will need to ask so you aren't surprised when you get to your appointment.  2) Contact Your Local Health Department Not all health departments have doctors that can see patients for sick visits, but many do, so it is worth a call to see if yours does. If you don't know where your local health department is, you can check in your phone book. The CDC also has a tool to help you locate your state's health department, and many state websites also have listings of all of their local health departments.  3) Find a Walk-in Clinic If your illness is not likely to be very severe or complicated, you may want to try a walk in clinic. These are popping up all over the country in pharmacies, drugstores, and shopping centers. They're usually staffed by nurse practitioners or physician assistants that have been trained to treat common illnesses and complaints. They're usually fairly quick and inexpensive. However, if you have serious medical issues or chronic medical problems, these are probably not your best option.  No Primary Care Doctor: - Call Health Connect at  [phone] - they can help you locate a primary care doctor that   accepts your insurance, provides certain services, etc. - Physician Referral Service- [phone]  Chronic Pain Problems: Organization         Address  Phone   Notes  [facility]  [phone] Patients need to be referred by their primary care doctor.   Medication Assistance: Organization         Address  Phone   Notes  [facility] Medication [facility] [address] [place], [place] [address] [phone] --Must be a resident of [facility] -- Must have NO insurance coverage whatsoever (no  Medicaid/ Medicare, etc.) -- The pt. MUST have a primary care doctor that directs their care regularly and follows them in the community   [facility]ist  [phone]   [employer]  [phone]    Agencies that provide inexpensive medical care: Organization         Address  Phone   Notes  [facility]  [phone]   [facility]    [phone]   [facility] [address] [place], [place] [address] [phone]   [facility] [address] [place] [place] [phone]   Planned Parenthood    [phone]   [facility]    [phone]   [facility] and [facility]  [address], [place] Phone:  [phone], Fax:  [phone] Hours of Operation:  9 am - 6 pm, M-F.  Also accepts Medicaid/Medicare and self-pay.  [facility]  [address], [place] Phone: [phone], Fax: [phone]. Hours of Operation:  8:30 am - 5:30 pm, M-F.  Also accepts Medicaid and self-pay.  [facility] [place] [address] [place] Phone: [phone]   [facility] [address] Natasha Bence [place], [place] [phone], Ext. [phone] Mondays & Thursdays: 7-9 AM.  First 15 patients are seen on a first come, first serve basis.    Medicaid-accepting [facility] Providers:  Organization          Address  Phone   Notes  [facility] [address], [place] [phone] Also accepts self-pay patients.  [facility] [address] Laurell Josephs [place], [place]  [phone]   [facility] [address], [place] [phone]   [facility] [address] [place] [phone]   Renaye Rakers [address], [place]   [phone] Only accepts [place] Access [place] patients after they have their name applied to their card.   Self-Pay (no insurance) in [facility]:  Organization         Address  Phone   Notes  Sickle Cell Patients, [facility] Internal Medicine [address] [place], [place] [phone]   [facility] [address] [place] Revels [place], [place] [phone]   [facility] [place]  [address], [place] [phone]   [facility]/Dr. Osei-Bonsu  [address] [place] or [address], [place] [phone] Phone number for both [place] and [place] locations is the same.  [facility] [address], [place] [phone]   [facility] [address] [place] or [address] [phone]   [facility] [address], [place] [phone], phone; [phone], fax Sees patients 1st and 3rd Saturday of every month.  Must not qualify for public or private insurance (i.e. Medicaid, Medicare, [place] Health Choice, Veterans' Benefits)  Household income should be no more than 200% of the poverty level The clinic cannot treat you if you are pregnant or think you are pregnant  Sexually transmitted diseases are not treated at the clinic.    Dental Care: Organization         Address  Phone  Notes  [facility] of [facility] [address] [place],  [place] [phone] Accepts children up to age 3 who are enrolled in [place] or [place] Health Choice; pregnant women with a Medicaid card; and children who have applied for Medicaid or [place] Health Choice, but were declined, whose parents can pay a reduced fee at time of service.  [facility] Department of [facility]  [address], [place] [phone] Accepts children up to age 47 who are enrolled in [place] or [place] Health Choice; pregnant women with a Medicaid card; and children who have applied for Medicaid or  Health Choice, but were declined, whose parents can pay a reduced fee at time of service.  [facility]  [address] [place], [place] [phone] Patients are seen by appointment only. Walk-ins are not accepted. [facility] will see patients 20 years of age and older. Monday - Tuesday (8am-5pm) Most Wednesdays (8:30-5pm) $30 per visit, cash only  [facility] Access [facility]  [address], [facility] [phone] Patients are seen by appointment only. Walk-ins are not accepted. [facility] will see patients 88 years of age and older. One Wednesday Evening (Monthly: Volunteer Based).  $30 per visit, cash only  [employer]  [phone] for adults; Children under age 73, call Graduate Pediatric Dentistry at [phone]. Children aged 22-14, please call [phone] to request a pediatric application.  Dental services are provided in all areas of dental care including fillings, crowns and bridges, complete and partial dentures, implants, gum treatment, root canals, and extractions. Preventive care is also provided. Treatment is provided to both adults and children. Patients are selected via a lottery and there is often a waiting list.   [facility] [address], [place]  [phone] [email or web address]   [facility] [address] [place], [place]  [phone], Ext. [phone] Second and Fourth Thursday of each month, opens at 6:30 AM; Clinic ends at 9 AM.  Patients are seen on a first-come first-served basis, and a limited number are seen during each clinic.   [facility]  [address] Ether Griffins [place], [place] [phone]   Eligibility Requirements You must have lived in [place], [place], or [place] counties for at least the last three months.   You cannot be eligible for state or federal sponsored [place], including [employer], [place], or [employer].   You generally cannot be eligible for healthcare insurance through your employer.    How to apply: Eligibility screenings are held every Tuesday and Wednesday afternoon from 1:00 pm until 4:00 pm. You do not need an appointment for the interview!  [facility] [address], [place], [place] [phone]   [facility] Health Department  [phone]   [facility] Health Department  [phone]   [facility] Health Department  [phone]    Behavioral Health Resources in the Community: Intensive Outpatient Programs Organization         Address  Phone  Notes  [facility] Services [address] [place], [place] [phone]   [facility]patient [address], [place], [place] [phone]   [facility] [address] [place], [place]  [phone]   [facility] Mental Health [address]  [place], [place] [phone] or [phone]   Substance Abuse Resources Organization         Address  Phone  Notes  [facility]  [phone]   [facility]  [phone]   [facility] [place]  [phone]   Floydene Flock  [phone]   Residential & Outpatient Substance Abuse Program  [phone]   Psychological Services Organization         Address  Phone  Notes  [facility]  [phone]   [facility]  [phone]    [facility] Mental Health [address] [place] [phone] or [phone]    Mobile Crisis Teams Organization         Address  Phone  Notes  [facility], Mobile Crisis Care Unit  [phone]   [facility]  [address] [place], [place] [phone]   Doristine Locks [address] [place] [phone]    Self-Help/Support Groups Organization         Address  Phone             Notes  [facility] [place] - variety of support groups  336- I7437963 Call for more information  Narcotics Anonymous (NA), [facility] [address], [employer] [place]  2 meetings at this location   Statistician         Address  Phone  Notes  [facility] [address],    [place]  [phone]   [facility]  [address], [place] [address], [place], [place] [phone]   [facility] [address] [place], [place] [phone] Admissions: 8am-3pm M-F  [facility] [address],    [place], [place] [phone]   [facility] [address] [place], [place], [place] [phone]   [facility] [address],  [place], [place] [phone]   [facility] - Intensive Outpatient [address]urell Joseph[address], [place], [place] [phone]   [facility] ([facility] [address] [place]  [place], [place] [phone] or [phone]   [facility] ([facility]) [address], [place], [place] [phone] Accepts Medicaid  [facility] [place] [address],  [place] [phone] Substance Abuse/Addiction Treatment   [facility] Organization         Address  Phone  Notes  [facility]  [phone]   Angie Fava, PhD [address] Ervin Knack [place], [place]   [phone] or [phone]   [facility] Behavioral   [address] [place], [place] [phone]   [facility] [address], [place], [place] [phone] Insurance/Medicaid/sponsorship through [facility] and Families [address]                                    [place], [place] [phone] Therapy/tele-psych/case  [facility] [address], [place] [phone]    Dr. Lolly Mustache  [phone]   [facility]  [employer] [facility] Dept. 1) [address] [place] 2) [address] [place] 3)  [address], [place] [phone]  [phone]   [facility] Child Abuse Hotline 512-334-4991 or [phone] (After Hours)

## 2015-01-29 NOTE — ED Provider Notes (Signed)
CSN: [id number]     Arrival date & time 01/29/15  1226 History  By signing my name below, I, Ronney Lion, attest that this documentation has been prepared under the direction and in the presence of [facility], PA-C. Electronically Signed: Ronney Lion, ED Scribe. 01/29/2015. 1:57 PM.    No chief complaint on file.  The history is provided by the patient. No language interpreter was used.   HPI Comments: Theresa Avila is a 29 y.o. female who presents to the Emergency Department complaining of constant, throbbing back pain that began 1 week ago. She states she suspects this is from overuse from caring for her 4 children and from working at [employer], which involves frequent moving and bending.  Moving exacerbates her pain.     Patient also complains of near-daily episodes of severe, throbbing, headaches typically last for 3 hours at a time, for the past month. She complains of associated photophobia, phonophobia, and nausea. Her last headache occurred 2 days ago and was accompanied by associated vomiting. She states she has been taking Tylenol during the day and Tylenol PM at night both for her headaches and for her back.   She denies weakness, numbness, bowel or bladder incontinence, chest pain, shortness of breath, cough, congestion, rhinorrhea, sore throat, abdominal pain, diarrhea, dysuria, urinary urgency, menstrual problems, or vaginal discharge. Patient states her LMP was last week. She denies the possibility of pregnancy.  History reviewed. No pertinent past medical history. Past Surgical History  Procedure Laterality Date  . Tubal ligation     History reviewed. No pertinent family history. Social History  Substance Use Topics  . Smoking status: Current Every Day Smoker -- 1.00 packs/day    Types: Cigarettes  . Smokeless tobacco: None  . Alcohol Use: No   OB History    No data available     Review of Systems  HENT: Negative for congestion, rhinorrhea and sore throat.   Eyes:  Positive for photophobia.  Respiratory: Negative for cough and shortness of breath.   Cardiovascular: Negative for chest pain.  Gastrointestinal: Positive for nausea and vomiting. Negative for abdominal pain and diarrhea.  Genitourinary: Negative for dysuria, urgency and vaginal discharge.       Negative for bowel or bladder incontinence  Musculoskeletal: Positive for back pain.  Allergic/Immunologic: Negative for immunocompromised state.  Neurological: Positive for headaches. Negative for weakness and numbness.   Allergies  Review of patient's allergies indicates no known allergies.  Home Medications   Prior to Admission medications   Not on File   BP 118/61 mmHg  Pulse 63  Temp(Src) 97.7 F (36.5 C) (Oral)  Resp 20  SpO2 99%  LMP 01/28/2015 Physical Exam  Constitutional: She appears well-developed and well-nourished. No distress.  HENT:  Head: Normocephalic and atraumatic.  Neck: Neck supple.  Cardiovascular: Normal rate and regular rhythm.   Pulmonary/Chest: Effort normal and breath sounds normal. No respiratory distress. She has no wheezes. She has no rales.  Abdominal: Soft. She exhibits no distension and no mass. There is no tenderness. There is no rebound and no guarding.  Musculoskeletal:  Diffuse tenderness to the lumbar area. No crepitus or stepoffs.  Lower extremities:  Strength 5/5, sensation intact, distal pulses intact.     Neurological: She is alert.  CN II-XII intact, EOMs intact, no pronator drift, grip strengths equal bilaterally; strength 5/5 in all extremities, sensation intact in all extremities; finger to nose, heel to shin, rapid alternating movements normal; gait is normal.  Skin: She is not diaphoretic.  Nursing note and vitals reviewed.   ED Course  Procedures (including critical care time)  DIAGNOSTIC STUDIES: Oxygen Saturation is 99% on RA, normal by my interpretation.    COORDINATION OF CARE: 1:50 PM - Discussed treatment plan with pt at  bedside which includes Rx pain-relieving medications and referral to PCP. Will also provide back exercises to alleviate pain. Cautioned pt not to drive, due to the dose of oxycodone administered to pt while in triage. Pt verbalized understanding and agreed to plan.   MDM   Final diagnoses:  Low back pain without sciatica, unspecified back pain laterality  Hx of headache   Afebrile, nontoxic patient with mechanical low back pain. No red flags.  Also with c/o recurrent headaches - no headache x 2 days.  Neurologically intact.  No red flags for headache. D/C home with ibuprofen, robaxin, PCP follow up.  Discussed result, findings, treatment, and follow up  with patient.  Pt given return precautions.  Pt verbalizes understanding and agrees with plan.       I personally performed the services described in this documentation, which was scribed in my presence. The recorded information has been reviewed and is accurate.     Trixie Dredge, PA-C 01/29/15 1458  Vanetta Mulders, MD 01/31/15 [phone]

## 2015-01-29 NOTE — ED Notes (Signed)
Pt presents with 1 week h/o low back pain, pt denies any injury.  Pt denies any dysuria.  Pt also reports 2 day h/o frontal headache, +nausea and photophobia; reports OTC  meds are not working.

## 2017-02-24 ENCOUNTER — Encounter (HOSPITAL_COMMUNITY): Payer: Self-pay | Admitting: Emergency Medicine

## 2017-02-24 ENCOUNTER — Ambulatory Visit (HOSPITAL_COMMUNITY)
Admission: EM | Admit: 2017-02-24 | Discharge: 2017-02-24 | Disposition: A | Discharge status: Home / Self Care | Payer: Medicaid Other | Attending: Family Medicine | Admitting: Family Medicine

## 2017-02-24 DIAGNOSIS — K047 Periapical abscess without sinus: Secondary | ICD-10-CM | POA: Diagnosis not present

## 2017-02-24 DIAGNOSIS — K029 Dental caries, unspecified: Secondary | ICD-10-CM

## 2017-02-24 DIAGNOSIS — K0889 Other specified disorders of teeth and supporting structures: Secondary | ICD-10-CM | POA: Diagnosis not present

## 2017-02-24 MED ORDER — PENICILLIN V POTASSIUM 500 MG PO TABS: 500.0000 mg | ORAL_TABLET | Freq: Three times a day (TID) | ORAL | 0 refills | Status: DC

## 2017-02-24 NOTE — Discharge Instructions (Signed)
Keep your dental appointment

## 2017-02-24 NOTE — ED Triage Notes (Signed)
Pt here for dental pain onset 3 days associated w/right sided facial swelling  Reports pain has subsided  Thinks she had a reaction to some OTC medication that she was using to numb the pain.   Denies fevers  Sts she will see her dentist this Monday   A&O x4... NAD... Ambulatory

## 2017-02-24 NOTE — ED Provider Notes (Signed)
[facility]   [id number] 02/24/17 Arrival Time: 1232   SUBJECTIVE:  Theresa Avila is a 31 y.o. female who presents to the urgent care with complaint of right jaw swelling and pain after filling dropped out recently.  She had a migraine earlier this morning but she took medicine that relieved it.  Denies fevers  Sts she will see her dentist this Monday    History reviewed. No pertinent past medical history. History reviewed. No pertinent family history. Social History   Social History  . Marital status: Single    Spouse name: N/A  . Number of children: N/A  . Years of education: N/A   Occupational History  . Not on file.   Social History Main Topics  . Smoking status: Current Every Day Smoker    Packs/day: 1.00    Types: Cigarettes  . Smokeless tobacco: Never Used  . Alcohol use No  . Drug use: No  . Sexual activity: Not on file   Other Topics Concern  . Not on file   Social History Narrative  . No narrative on file   No outpatient prescriptions have been marked as taking for the 02/24/17 encounter [facility](Hospital Encounter).   No Known Allergies    ROS: As per HPI, remainder of ROS negative.   OBJECTIVE:   Vitals:   02/24/17 1318  BP: 134/83  Pulse: 79  Resp: 20  Temp: 98.4 F (36.9 C)  TempSrc: Oral  SpO2: 97%     General appearance: alert; no distress Eyes: PERRL; EOMI; conjunctiva normal HENT: swollen right cheek; atraumatic;  external ears normal without trauma; nasal mucosa normal; oral mucosa mildly swollen right jaw mucosa adjacent tooth #29 with missing filling Neck: supple Back: no CVA tenderness Extremities: no cyanosis or edema; symmetrical with no gross deformities Skin: warm and dry Neurologic: normal gait; grossly normal Psychological: alert and cooperative; normal mood and affect      Labs:  Results for orders placed or performed during the hospital encounter of 10/24/13  Urine culture  Result Value Ref Range     Specimen Description URINE, CLEAN CATCH    Special Requests NONE    Culture  Setup Time      10/25/2013 15:34 [facility]formed at [facility]   Colony Count      >=100,000 COLONIES/ML [facility]formed at [facility]   Culture      ESCHERICHIA COLI Performed at [facility]   Report Status 10/27/2013 FINAL    Organism ID, Bacteria ESCHERICHIA COLI       Susceptibility   Escherichia coli - MIC*    AMPICILLIN <=2 SENSITIVE Sensitive     CEFAZOLIN <=4 SENSITIVE Sensitive     CEFTRIAXONE <=1 SENSITIVE Sensitive     CIPROFLOXACIN <=0.25 SENSITIVE Sensitive     GENTAMICIN <=1 SENSITIVE Sensitive     LEVOFLOXACIN <=0.12 SENSITIVE Sensitive     NITROFURANTOIN <=16 SENSITIVE Sensitive     TOBRAMYCIN <=1 SENSITIVE Sensitive     TRIMETH/SULFA <=20 SENSITIVE Sensitive     PIP/TAZO <=4 SENSITIVE Sensitive     * ESCHERICHIA COLI  CBC with Differential  Result Value Ref Range   WBC 13.6 (H) 4.0 - 10.5 K/uL   RBC 3.81 (L) 3.87 - 5.11 MIL/uL   Hemoglobin 12.4 12.0 - 15.0 g/dL   HCT 78.236.9 95.636.0 - 21.346.0 %   MCV 96.9 78.0 - 100.0 fL   MCH 32.5 26.0 - 34.0 pg   MCHC 33.6 30.0 - 36.0 g/dL  RDW 12.1 11.5 - 15.5 %   Platelets 164 150 - 400 K/uL   Neutrophils Relative % 75 43 - 77 %   Neutro Abs 10.2 (H) 1.7 - 7.7 K/uL   Lymphocytes Relative 16 12 - 46 %   Lymphs Abs 2.1 0.7 - 4.0 K/uL   Monocytes Relative 9 3 - 12 %   Monocytes Absolute 1.2 (H) 0.1 - 1.0 K/uL   Eosinophils Relative 0 0 - 5 %   Eosinophils Absolute 0.0 0.0 - 0.7 K/uL   Basophils Relative 0 0 - 1 %   Basophils Absolute 0.0 0.0 - 0.1 K/uL  Comprehensive metabolic panel  Result Value Ref Range   Sodium 137 137 - 147 mEq/L   Potassium 3.2 (L) 3.7 - 5.3 mEq/L   Chloride 99 96 - 112 mEq/L   CO2 24 19 - 32 mEq/L   Glucose, Bld 90 70 - 99 mg/dL   BUN 5 (L) 6 - 23 mg/dL   Creatinine, Ser 1.61 0.50 - 1.10 mg/dL   Calcium 9.1 8.4 - 09.6 mg/dL   Total Protein 7.3 6.0 - 8.3 g/dL   Albumin 3.5 3.5 - 5.2 g/dL   AST 10  0 - 37 U/L   ALT 12 0 - 35 U/L   Alkaline Phosphatase 76 39 - 117 U/L   Total Bilirubin 0.4 0.3 - 1.2 mg/dL   GFR calc non Af Amer >90 >90 mL/min   GFR calc Af Amer >90 >90 mL/min  Lipase, blood  Result Value Ref Range   Lipase 12 11 - 59 U/L  Pregnancy, urine  Result Value Ref Range   Preg Test, Ur NEGATIVE NEGATIVE  Urinalysis, Routine w reflex microscopic  Result Value Ref Range   Color, Urine AMBER (A) YELLOW   APPearance CLOUDY (A) CLEAR   Specific Gravity, Urine 1.017 1.005 - 1.030   pH 6.0 5.0 - 8.0   Glucose, UA NEGATIVE NEGATIVE mg/dL   Hgb urine dipstick MODERATE (A) NEGATIVE   Bilirubin Urine NEGATIVE NEGATIVE   Ketones, ur 15 (A) NEGATIVE mg/dL   Protein, ur 30 (A) NEGATIVE mg/dL   Urobilinogen, UA 2.0 (H) 0.0 - 1.0 mg/dL   Nitrite POSITIVE (A) NEGATIVE   Leukocytes, UA LARGE (A) NEGATIVE  Urine microscopic-add on  Result Value Ref Range   Squamous Epithelial / LPF MANY (A) RARE   WBC, UA 11-20 <3 WBC/hpf   RBC / HPF 3-6 <3 RBC/hpf   Bacteria, UA MANY (A) RARE    Labs Reviewed - No data to display  No results found.     ASSESSMENT & PLAN:  1. Dental caries   2. Pain, dental   3. Infected dental caries     Meds ordered this encounter  Medications  . penicillin v potassium (VEETID) 500 MG tablet    Sig: Take 1 tablet (500 mg total) by mouth 3 (three) times daily.    Dispense:  30 tablet    Refill:  0    Reviewed expectations re: course of current medical issues. Questions answered. Outlined signs and symptoms indicating need for more acute intervention. Patient verbalized understanding. After Visit Summary given.    Procedures:      Elvina Sidle, MD 02/24/17 1320

## 2021-09-05 ENCOUNTER — Ambulatory Visit (INDEPENDENT_AMBULATORY_CARE_PROVIDER_SITE_OTHER): Payer: Medicaid Other | Admitting: Primary Care

## 2021-09-05 ENCOUNTER — Encounter (INDEPENDENT_AMBULATORY_CARE_PROVIDER_SITE_OTHER): Payer: Self-pay | Admitting: Primary Care

## 2021-09-05 VITALS — BP 163/102 | HR 80 | Temp 97.9°F | Ht 63.0 in | Wt 142.4 lb

## 2021-09-05 DIAGNOSIS — I10 Essential (primary) hypertension: Secondary | ICD-10-CM | POA: Diagnosis not present

## 2021-09-05 DIAGNOSIS — F17210 Nicotine dependence, cigarettes, uncomplicated: Secondary | ICD-10-CM | POA: Diagnosis not present

## 2021-09-05 DIAGNOSIS — N920 Excessive and frequent menstruation with regular cycle: Secondary | ICD-10-CM

## 2021-09-05 DIAGNOSIS — Z23 Encounter for immunization: Secondary | ICD-10-CM

## 2021-09-05 DIAGNOSIS — Z7689 Persons encountering health services in other specified circumstances: Secondary | ICD-10-CM

## 2021-09-05 DIAGNOSIS — Z720 Tobacco use: Secondary | ICD-10-CM

## 2021-09-05 DIAGNOSIS — Z131 Encounter for screening for diabetes mellitus: Secondary | ICD-10-CM

## 2021-09-05 MED ORDER — HYDROCHLOROTHIAZIDE 25 MG PO TABS: 25.0000 mg | ORAL_TABLET | Freq: Every day | ORAL | 3 refills | Status: DC

## 2021-09-05 NOTE — Patient Instructions (Signed)
Hypertension, Adult High blood pressure (hypertension) is when the force of blood pumping through the arteries is too strong. The arteries are the blood vessels that carry blood from the heart throughout the body. Hypertension forces the heart to work harder to pump blood and may cause arteries to become narrow or stiff. Untreated or uncontrolled hypertension can lead to a heart attack, heart failure, a stroke, kidney disease, and other problems. A blood pressure reading consists of a higher number over a lower number. Ideally, your blood pressure should be below 120/80. The first ("top") number is called the systolic pressure. It is a measure of the pressure in your arteries as your heart beats. The second ("bottom") number is called the diastolic pressure. It is a measure of the pressure in your arteries as the heart relaxes. What are the causes? The exact cause of this condition is not known. There are some conditions that result in high blood pressure. What increases the risk? Certain factors may make you more likely to develop high blood pressure. Some of these risk factors are under your control, including: Smoking. Not getting enough exercise or physical activity. Being overweight. Having too much fat, sugar, calories, or salt (sodium) in your diet. Drinking too much alcohol. Other risk factors include: Having a personal history of heart disease, diabetes, high cholesterol, or kidney disease. Stress. Having a family history of high blood pressure and high cholesterol. Having obstructive sleep apnea. Age. The risk increases with age. What are the signs or symptoms? High blood pressure may not cause symptoms. Very high blood pressure (hypertensive crisis) may cause: Headache. Fast or irregular heartbeats (palpitations). Shortness of breath. Nosebleed. Nausea and vomiting. Vision changes. Severe chest pain, dizziness, and seizures. How is this diagnosed? This condition is diagnosed by  measuring your blood pressure while you are seated, with your arm resting on a flat surface, your legs uncrossed, and your feet flat on the floor. The cuff of the blood pressure monitor will be placed directly against the skin of your upper arm at the level of your heart. Blood pressure should be measured at least twice using the same arm. Certain conditions can cause a difference in blood pressure between your right and left arms. If you have a high blood pressure reading during one visit or you have normal blood pressure with other risk factors, you may be asked to: Return on a different day to have your blood pressure checked again. Monitor your blood pressure at home for 1 week or longer. If you are diagnosed with hypertension, you may have other blood or imaging tests to help your health care provider understand your overall risk for other conditions. How is this treated? This condition is treated by making healthy lifestyle changes, such as eating healthy foods, exercising more, and reducing your alcohol intake. You may be referred for counseling on a healthy diet and physical activity. Your health care provider may prescribe medicine if lifestyle changes are not enough to get your blood pressure under control and if: Your systolic blood pressure is above 130. Your diastolic blood pressure is above 80. Your personal target blood pressure may vary depending on your medical conditions, your age, and other factors. Follow these instructions at home: Eating and drinking  Eat a diet that is high in fiber and potassium, and low in sodium, added sugar, and fat. An example of this eating plan is called the DASH diet. DASH stands for Dietary Approaches to Stop Hypertension. To eat this way: Eat   plenty of fresh fruits and vegetables. Try to fill one half of your plate at each meal with fruits and vegetables. Eat whole grains, such as whole-wheat pasta, Slaugh rice, or whole-grain bread. Fill about one  fourth of your plate with whole grains. Eat or drink low-fat dairy products, such as skim milk or low-fat yogurt. Avoid fatty cuts of meat, processed or cured meats, and poultry with skin. Fill about one fourth of your plate with lean proteins, such as fish, chicken without skin, beans, eggs, or tofu. Avoid pre-made and processed foods. These tend to be higher in sodium, added sugar, and fat. Reduce your daily sodium intake. Many people with hypertension should eat less than 1,500 mg of sodium a day. Do not drink alcohol if: Your health care provider tells you not to drink. You are pregnant, may be pregnant, or are planning to become pregnant. If you drink alcohol: Limit how much you have to: 0-1 drink a day for women. 0-2 drinks a day for men. Know how much alcohol is in your drink. In the [place] one drink equals one 12 oz bottle of beer (355 mL), one 5 oz glass of wine (148 mL), or one 1 oz glass of hard liquor (44 mL). Lifestyle  Work with your health care provider to maintain a healthy body weight or to lose weight. Ask what an ideal weight is for you. Get at least 30 minutes of exercise that causes your heart to beat faster (aerobic exercise) most days of the week. Activities may include walking, swimming, or biking. Include exercise to strengthen your muscles (resistance exercise), such as Pilates or lifting weights, as part of your weekly exercise routine. Try to do these types of exercises for 30 minutes at least 3 days a week. Do not use any products that contain nicotine or tobacco. These products include cigarettes, chewing tobacco, and vaping devices, such as e-cigarettes. If you need help quitting, ask your health care provider. Monitor your blood pressure at home as told by your health care provider. Keep all follow-up visits. This is important. Medicines Take over-the-counter and prescription medicines only as told by your health care provider. Follow directions carefully. Blood  pressure medicines must be taken as prescribed. Do not skip doses of blood pressure medicine. Doing this puts you at risk for problems and can make the medicine less effective. Ask your health care provider about side effects or reactions to medicines that you should watch for. Contact a health care provider if you: Think you are having a reaction to a medicine you are taking. Have headaches that keep coming back (recurring). Feel dizzy. Have swelling in your ankles. Have trouble with your vision. Get help right away if you: Develop a severe headache or confusion. Have unusual weakness or numbness. Feel faint. Have severe pain in your chest or abdomen. Vomit repeatedly. Have trouble breathing. These symptoms may be an emergency. Get help right away. Call 911. Do not wait to see if the symptoms will go away. Do not drive yourself to the hospital. Summary Hypertension is when the force of blood pumping through your arteries is too strong. If this condition is not controlled, it may put you at risk for serious complications. Your personal target blood pressure may vary depending on your medical conditions, your age, and other factors. For most people, a normal blood pressure is less than 120/80. Hypertension is treated with lifestyle changes, medicines, or a combination of both. Lifestyle changes include losing weight, eating a healthy,   low-sodium diet, exercising more, and limiting alcohol. This information is not intended to replace advice given to you by your health care provider. Make sure you discuss any questions you have with your health care provider. Document Revised: 02/22/2021 Document Reviewed: 02/22/2021 Elsevier Patient Education  2023 Elsevier Inc.  

## 2021-09-05 NOTE — Progress Notes (Signed)
? ?New Patient Office Visit ? ?Subjective   ? ?Patient ID: Theresa Avila, female    DOB: [birth year]  Age: 36 y.o. MRN: [id number] ? ?CC:  ?Chief Complaint  ?Patient presents with  ? New Patient (Initial Visit)  ?  Not fasting. Experiencing very bad migraines with elevated Bp readings  ? ? ?HPI ?[name] is a 36 year old normal BMI 25.23 who presents to establish care.  Blood pressure is uncontrolled 163/102 she also complains of headaches.  Discussed causes of high blood pressure and she has a family history of high blood pressure.  There was no mention of anyone having strokes or heart attacks. Patient has  No chest pain, No abdominal pain - No Nausea, No new weakness tingling or numbness, No Cough - shortness of breath ? ? ?Outpatient Encounter Medications as of 09/05/2021  ?Medication Sig  ? hydrochlorothiazide (HYDRODIURIL) 25 MG tablet Take 1 tablet (25 mg total) by mouth daily.  ? [DISCONTINUED] ibuprofen (ADVIL,MOTRIN) 600 MG tablet Take 1 tablet (600 mg total) by mouth every 6 (six) hours as needed for mild pain or moderate pain.  ? [DISCONTINUED] methocarbamol (ROBAXIN) 500 MG tablet Take 1-2 tablets (500-1,000 mg total) by mouth every 6 (six) hours as needed.  ? [DISCONTINUED] penicillin v potassium (VEETID) 500 MG tablet Take 1 tablet (500 mg total) by mouth 3 (three) times daily.  ? ?No facility-administered encounter medications on file as of 09/05/2021.  ? ? ?History reviewed. No pertinent past medical history. ? ?Past Surgical History:  ?Procedure Laterality Date  ? TUBAL LIGATION    ? ? ?History reviewed. No pertinent family history. ? ?Social History  ? ?Socioeconomic History  ? Marital status: Single  ?  Spouse name: Not on file  ? Number of children: Not on file  ? Years of education: Not on file  ? Highest education level: Not on file  ?Occupational History  ? Not on file  ?Tobacco Use  ? Smoking status: Every Day  ?  Packs/day: 1.00  ?  Types: Cigarettes  ? Smokeless tobacco: Never  ?Substance  and Sexual Activity  ? Alcohol use: No  ? Drug use: No  ? Sexual activity: Not on file  ?Other Topics Concern  ? Not on file  ?Social History Narrative  ? Not on file  ? ?Social Determinants of Health  ? ?Financial Resource Strain: Not on file  ?Food Insecurity: Not on file  ?Transportation Needs: Not on file  ?Physical Activity: Not on file  ?Stress: Not on file  ?Social Connections: Not on file  ?Intimate Partner Violence: Not on file  ? ? ?ROS ?Comprehensive ROS Pertinent positive and negative noted in HPI   ?  ?Objective   ? ?BP (!) 163/102 (BP Location: Right Arm, Patient Position: Sitting, Cuff Size: Normal)   Pulse 80   Temp 97.9 ?F (36.6 ?C) (Oral)   Ht $R'5\' 3"'ie$  (1.6 m)   Wt 142 lb 6.4 oz (64.6 kg)   LMP 08/20/2021   SpO2 99%   BMI 25.23 kg/m?  ? ?Physical Exam ?Vitals reviewed.  ?Constitutional:   ?   Appearance: Normal appearance. She is normal weight.  ?HENT:  ?   Head: Normocephalic.  ?   Right Ear: Tympanic membrane and external ear normal.  ?   Left Ear: Tympanic membrane and external ear normal.  ?   Nose: Nose normal.  ?Eyes:  ?   Extraocular Movements: Extraocular movements intact.  ?   Conjunctiva/sclera: Conjunctivae normal.  ?  Pupils: Pupils are equal, round, and reactive to light.  ?Cardiovascular:  ?   Rate and Rhythm: Normal rate and regular rhythm.  ?Pulmonary:  ?   Effort: Pulmonary effort is normal.  ?   Breath sounds: Normal breath sounds.  ?Abdominal:  ?   General: Abdomen is flat. Bowel sounds are normal.  ?   Palpations: Abdomen is soft.  ?Musculoskeletal:     ?   General: Normal range of motion.  ?   Cervical back: Normal range of motion.  ?Skin: ?   General: Skin is warm and dry.  ?Neurological:  ?   Mental Status: She is alert and oriented to person, place, and time.  ?Psychiatric:     ?   Mood and Affect: Mood normal.     ?   Behavior: Behavior normal.     ?   Thought Content: Thought content normal.     ?   Judgment: Judgment normal.  ? ? ?  ? ?Assessment & Plan:  ?Lazette was  seen today for new patient (initial visit). ? ?Diagnoses and all orders for this visit: ? ?Screening for diabetes mellitus ?-     Tdap vaccine greater than or equal to 7yo IM ? ?Encounter to establish care ?Establish care ? ?Essential hypertension ?We have discussed target BP range and blood pressure goal. I have advised patient to check BP regularly and to call us back or report to clinic if the numbers are consistently higher than 140/90. We discussed the importance of compliance with medical therapy and DASH diet recommended, consequences of uncontrolled hypertension discussed.  ?- continue current BP medications  ?-     CMP14+EGFR ?-     hydrochlorothiazide (HYDRODIURIL) 25 MG tablet; Take 1 tablet (25 mg total) by mouth daily. ? ?Tobacco abuse ?- I have recommended complete cessation of tobacco use. I have discussed various options available for assistance with tobacco cessation including over the counter methods (Nicotine gum, patch and lozenges). We also discussed prescription options (Chantix, Nicotine Inhaler / Nasal Spray). The patient is not interested in pursuing any prescription tobacco cessation options at this time. ?- Patient declines at this time.  ?- Less than 5 minutes spent on counseling.  ? ?Menorrhagia with regular cycle ?-     CBC with Differential ? ? ?Return in about 4 weeks (around 10/03/2021) for medical conditions.  ? ?Kerin Perna, NP ? ? ?

## 2021-09-06 LAB — CBC WITH DIFFERENTIAL/PLATELET
Basophils Absolute: 0.1 10*3/uL (ref 0.0–0.2)
Basos: 1 %
EOS (ABSOLUTE): 0.2 10*3/uL (ref 0.0–0.4)
Eos: 3 %
Hematocrit: 42.2 % (ref 34.0–46.6)
Hemoglobin: 13.8 g/dL (ref 11.1–15.9)
Immature Grans (Abs): 0 10*3/uL (ref 0.0–0.1)
Immature Granulocytes: 0 %
Lymphocytes Absolute: 2.4 10*3/uL (ref 0.7–3.1)
Lymphs: 34 %
MCH: 33 pg (ref 26.6–33.0)
MCHC: 32.7 g/dL (ref 31.5–35.7)
MCV: 101 fL — ABNORMAL HIGH (ref 79–97)
Monocytes Absolute: 0.6 10*3/uL (ref 0.1–0.9)
Monocytes: 8 %
Neutrophils Absolute: 3.7 10*3/uL (ref 1.4–7.0)
Neutrophils: 54 %
Platelets: 252 10*3/uL (ref 150–450)
RBC: 4.18 x10E6/uL (ref 3.77–5.28)
RDW: 11.9 % (ref 11.7–15.4)
WBC: 6.9 10*3/uL (ref 3.4–10.8)

## 2021-09-06 LAB — CMP14+EGFR
ALT: 10 IU/L (ref 0–32)
AST: 14 IU/L (ref 0–40)
Albumin/Globulin Ratio: 1.7 (ref 1.2–2.2)
Albumin: 4.2 g/dL (ref 3.8–4.8)
Alkaline Phosphatase: 80 IU/L (ref 44–121)
BUN/Creatinine Ratio: 9 (ref 9–23)
BUN: 8 mg/dL (ref 6–20)
Bilirubin Total: <0.2 mg/dL (ref 0.0–1.2)
CO2: 23 mmol/L (ref 20–29)
Calcium: 9.7 mg/dL (ref 8.7–10.2)
Chloride: 107 mmol/L — ABNORMAL HIGH (ref 96–106)
Creatinine, Ser: 0.85 mg/dL (ref 0.57–1.00)
Globulin, Total: 2.5 g/dL (ref 1.5–4.5)
Glucose: 94 mg/dL (ref 70–99)
Potassium: 4.6 mmol/L (ref 3.5–5.2)
Sodium: 142 mmol/L (ref 134–144)
Total Protein: 6.7 g/dL (ref 6.0–8.5)
eGFR: 90 mL/min/{1.73_m2} (ref >59)

## 2021-10-06 ENCOUNTER — Encounter (INDEPENDENT_AMBULATORY_CARE_PROVIDER_SITE_OTHER): Payer: Self-pay | Admitting: Primary Care

## 2021-10-06 ENCOUNTER — Ambulatory Visit (INDEPENDENT_AMBULATORY_CARE_PROVIDER_SITE_OTHER): Payer: Medicaid Other | Admitting: Primary Care

## 2021-10-06 VITALS — BP 117/79 | HR 66 | Temp 98.2°F | Ht 63.0 in | Wt 136.8 lb

## 2021-10-06 DIAGNOSIS — I10 Essential (primary) hypertension: Secondary | ICD-10-CM | POA: Diagnosis not present

## 2021-10-06 NOTE — Progress Notes (Signed)
[facility]   Theresa Avila is a 36 y.o. female presents for hypertension evaluation, Denies shortness of breath, headaches, chest pain or lower extremity edema, sudden onset, vision changes, unilateral weakness, dizziness, paresthesias   Patient reports adherence with medications.  Dietary habits include: monitoring snacks and sodium intake  Exercise habits include:yes / walk , aerobics  Family / Social history: mother -CVA   No past medical history on file. Past Surgical History:  Procedure Laterality Date   TUBAL LIGATION     No Known Allergies Current Outpatient Medications on File Prior to Visit  Medication Sig Dispense Refill   hydrochlorothiazide (HYDRODIURIL) 25 MG tablet Take 1 tablet (25 mg total) by mouth daily. 90 tablet 3   No current facility-administered medications on file prior to visit.   Social History   Socioeconomic History   Marital status: Single    Spouse name: Not on file   Number of children: Not on file   Years of education: Not on file   Highest education level: Not on file  Occupational History   Not on file  Tobacco Use   Smoking status: Every Day    Packs/day: 1.00    Types: Cigarettes   Smokeless tobacco: Never   Tobacco comments:    Down to 1 cigarette per day  Substance and Sexual Activity   Alcohol use: No   Drug use: No   Sexual activity: Not on file  Other Topics Concern   Not on file  Social History Narrative   Not on file   Social Determinants of Health   Financial Resource Strain: Not on file  Food Insecurity: Not on file  Transportation Needs: Not on file  Physical Activity: Not on file  Stress: Not on file  Social Connections: Not on file  Intimate Partner Violence: Not on file   No family history on file.   OBJECTIVE:  Vitals:   10/06/21 1102  BP: 117/79  Pulse: 66  Temp: 98.2 F (36.8 C)  TempSrc: Oral  SpO2: 98%  Weight: 136 lb 12.8 oz (62.1 kg)  Height: 5\' 3"  (1.6 m)     Physical Exam Vitals reviewed.  Constitutional:      Appearance: Normal appearance. She is normal weight.  HENT:     Head: Normocephalic.     Right Ear: External ear normal.     Left Ear: External ear normal.     Nose: Nose normal.  Eyes:     Extraocular Movements: Extraocular movements intact.     Pupils: Pupils are equal, round, and reactive to light.  Cardiovascular:     Rate and Rhythm: Normal rate and regular rhythm.  Pulmonary:     Effort: Pulmonary effort is normal.     Breath sounds: Normal breath sounds.  Abdominal:     General: Abdomen is flat. Bowel sounds are normal.     Palpations: Abdomen is soft.  Musculoskeletal:        General: Normal range of motion.     Cervical back: Normal range of motion.  Skin:    General: Skin is warm and dry.  Neurological:     Mental Status: She is alert and oriented to person, place, and time.  Psychiatric:        Mood and Affect: Mood normal.        Behavior: Behavior normal.        Thought Content: Thought content normal.        Judgment: Judgment normal.  ROS Comprehensive ROS Pertinent positive and negative noted in HPI   Last 3 Office BP readings: BP Readings from Last 3 Encounters:  10/06/21 117/79  09/05/21 (!) 163/102  02/24/17 134/83    BMET    Component Value Date/Time   NA 142 09/05/2021 0948   K 4.6 09/05/2021 0948   CL 107 (H) 09/05/2021 0948   CO2 23 09/05/2021 0948   GLUCOSE 94 09/05/2021 0948   GLUCOSE 90 10/24/2013 1931   BUN 8 09/05/2021 0948   CREATININE 0.85 09/05/2021 0948   CALCIUM 9.7 09/05/2021 0948   GFRNONAA >90 10/24/2013 1931   GFRAA >90 10/24/2013 1931    Renal function: CrCl cannot be calculated (Patient's most recent lab result is older than the maximum 21 days allowed.).  Clinical ASCVD: No  The ASCVD Risk score (Arnett DK, et al., 2019) failed to calculate for the following reasons:   The 2019 ASCVD risk score is only valid for ages 73 to 58  ASCVD risk factors  include- [place]   ASSESSMENT & PLAN: Sabree was seen today for follow-up.  Diagnoses and all orders for this visit:  Essential hypertension Bp is unremarkable on HCTZ 25mg  daily She has made lifestyle modifications for blood pressure control including reduced dietary sodium, increased exercise, weight reduction and adequate sleep. Also, educated patient about the risk for cardiovascular events, stroke and heart attack. Also counseled patient about the importance of medication adherence. If you participate in smoking, it is important to stop using tobacco as this will increase the risks associated with uncontrolled blood pressure.  Goal BP:  For patients younger than 60: Goal BP < 130/80. For patients 60 and older: Goal BP < 140/90. For patients with diabetes: Goal BP < 130/80. Your most recent BP: 117/79  Minimize salt intake. Minimize alcohol intake    This note has been created with . Any transcriptional errors are unintentional.   Education officer, environmental, NP 10/06/2021, 11:11 AM

## 2021-11-03 ENCOUNTER — Ambulatory Visit (INDEPENDENT_AMBULATORY_CARE_PROVIDER_SITE_OTHER): Payer: Medicaid Other | Admitting: Primary Care

## 2021-11-28 ENCOUNTER — Ambulatory Visit (INDEPENDENT_AMBULATORY_CARE_PROVIDER_SITE_OTHER): Payer: Medicaid Other | Admitting: Primary Care

## 2021-11-28 ENCOUNTER — Encounter (INDEPENDENT_AMBULATORY_CARE_PROVIDER_SITE_OTHER): Payer: Self-pay | Admitting: Primary Care

## 2021-11-28 ENCOUNTER — Other Ambulatory Visit (HOSPITAL_COMMUNITY)
Admission: RE | Admit: 2021-11-28 | Discharge: 2021-11-28 | Disposition: A | Discharge status: Home / Self Care | Payer: Medicaid Other | Source: Ambulatory Visit | Attending: Primary Care | Admitting: Primary Care

## 2021-11-28 VITALS — BP 130/83 | HR 55 | Temp 98.2°F | Ht 63.0 in | Wt 136.0 lb

## 2021-11-28 DIAGNOSIS — Z113 Encounter for screening for infections with a predominantly sexual mode of transmission: Secondary | ICD-10-CM

## 2021-11-28 DIAGNOSIS — Z124 Encounter for screening for malignant neoplasm of cervix: Secondary | ICD-10-CM | POA: Insufficient documentation

## 2021-11-28 NOTE — Patient Instructions (Signed)
Preventing Hypertension Hypertension, also called high blood pressure, is when the force of blood pumping through the arteries is too strong. Arteries are blood vessels that carry blood from the heart throughout the body. Often, hypertension does not cause symptoms until blood pressure is very high. It is important to have your blood pressure checked regularly. Diet and lifestyle changes can help you prevent hypertension, and they may make you feel better overall and improve your quality of life. If you already have hypertension, you may control it with diet and lifestyle changes, as well as with medicine. How can this condition affect me? Over time, hypertension can damage the arteries and decrease blood flow to important parts of the body, including the brain, heart, and kidneys. By keeping your blood pressure in a healthy range, you can help prevent complications like heart attack, heart failure, stroke, kidney failure, and vascular dementia. What can increase my risk? An unhealthy diet and a lack of physical activity can make you more likely to develop high blood pressure. Some other risk factors include: Age. The risk increases with age. Having family members who have had high blood pressure. Having certain health conditions, such as thyroid problems. Being overweight or obese. Drinking too much alcohol or caffeine. Having too much fat, sugar, calories, or salt (sodium) in your diet. Smoking or using illegal drugs. Taking certain medicines, such as antidepressants, decongestants, birth control pills, and NSAIDs, such as ibuprofen. What actions can I take to prevent or manage this condition? Work with your health care provider to make a hypertension prevention plan that works for you. You may be referred for counseling on a healthy diet and physical activity. Follow your plan and keep all follow-up visits. Diet changes Maintain a healthy diet. This includes: Eating less salt (sodium). Ask your  health care provider how much sodium is safe for you to have. The general recommendation is to have less than 1 tsp (2,300 mg) of sodium a day. Do not add salt to your food. Choose low-sodium options when grocery shopping and eating out. Limiting fats in your diet. You can do this by eating low-fat or fat-free dairy products and by eating less red meat. Eating more fruits, vegetables, and whole grains. Make a goal to eat: 1-2 cups of fresh fruits and vegetables each day. 3-4 servings of whole grains each day. Avoiding foods and beverages that have added sugars. Eating fish that contain healthy fats (omega-3 fatty acids), such as mackerel or salmon. If you need help putting together a healthy eating plan, try the DASH diet. This diet is high in fruits, vegetables, and whole grains. It is low in sodium, red meat, and added sugars. DASH stands for Dietary Approaches to Stop Hypertension. Lifestyle changes  Lose weight if you are overweight. Losing just 3-5% of your body weight can help prevent or control hypertension. For example, if your present weight is 200 lb (91 kg), a loss of 3-5% of your weight means losing 6-10 lb (2.7-4.5 kg). Ask your health care provider to help you with a diet and exercise plan to safely lose weight. Get enough exercise. Do at least 150 minutes of moderate-intensity exercise each week. You could do this in short exercise sessions several times a day, or you could do longer exercise sessions a few times a week. For example, you could take a brisk 10-minute walk or bike ride, 3 times a day, for 5 days a week. Find ways to reduce stress, such as exercising, meditating, listening to   music, or taking a yoga class. If you need help reducing stress, ask your health care provider. Do not use any products that contain nicotine or tobacco. These products include cigarettes, chewing tobacco, and vaping devices, such as e-cigarettes. Chemicals in tobacco and nicotine products raise your  blood pressure each time you use them. If you need help quitting, ask your health care provider. Learn how to check your blood pressure at home. Make sure that you know your personal target blood pressure, as told by your health care provider. Try to sleep 7-9 hours per night. Alcohol use Do not drink alcohol if: Your health care provider tells you not to drink. You are pregnant, may be pregnant, or are planning to become pregnant. If you drink alcohol: Limit how much you have to: 0-1 drink a day for women. 0-2 drinks a day for men. Know how much alcohol is in your drink. In the [place] one drink equals one 12 oz bottle of beer (355 mL), one 5 oz glass of wine (148 mL), or one 1 oz glass of hard liquor (44 mL). Medicines In addition to diet and lifestyle changes, your health care provider may recommend medicines to help lower your blood pressure. In general: You may need to try a few different medicines to find what works best for you. You may need to take more than one medicine. Take over-the-counter and prescription medicines only as told by your health care provider. Questions to ask your health care provider What is my blood pressure goal? How can I lower my risk for high blood pressure? How should I monitor my blood pressure at home? Where to find support Your health care provider can help you prevent hypertension and help you keep your blood pressure at a healthy level. Your local hospital or your community may also provide support services and prevention programs. The American Heart Association offers an online support network at supportnetwork.heart.org Where to find more information Learn more about hypertension from: National Heart, Lung, and Blood Institute: www.nhlbi.nih.gov Centers for Disease Control and Prevention: www.cdc.gov American Academy of Family Physicians: familydoctor.org Learn more about the DASH diet from: National Heart, Lung, and Blood Institute:  www.nhlbi.nih.gov Contact a health care provider if: You think you are having a reaction to medicines you have taken. You have recurrent headaches or feel dizzy. You have swelling in your ankles. You have trouble with your vision. Get help right away if: You have sudden, severe chest, back, or abdominal pain or discomfort. You have shortness of breath. You have a sudden, severe headache. These symptoms may be an emergency. Get help right away. Call 911. Do not wait to see if the symptoms will go away. Do not drive yourself to the hospital. Summary Hypertension often does not cause any symptoms until blood pressure is very high. It is important to get your blood pressure checked regularly. Diet and lifestyle changes are important steps in preventing hypertension. By keeping your blood pressure in a healthy range, you may prevent complications like heart attack, heart failure, stroke, and kidney failure. Work with your health care provider to make a hypertension prevention plan that works for you. This information is not intended to replace advice given to you by your health care provider. Make sure you discuss any questions you have with your health care provider. Document Revised: 02/03/2021 Document Reviewed: 02/03/2021 Elsevier Patient Education  2023 Elsevier Inc.  

## 2021-11-28 NOTE — Progress Notes (Signed)
  [facility]  WELL-WOMAN PHYSICAL & PAP Patient name: Theresa Avila MRN [id number]  Date of birth: May 25, 1985 Chief Complaint:   Gynecologic Exam  History of Present Illness:   Theresa Avila is a 36 y.o. No obstetric history on file. female being seen today for a routine well-woman exam.    The current method of family planning is tubal ligation.  Patient's last menstrual period was 11/17/2021 (approximate). Last pap unknown.  Review of Systems:    Denies any headaches, blurred vision, fatigue, shortness of breath, chest pain, abdominal pain, abnormal vaginal discharge/itching/odor/irritation, problems with periods, bowel movements, urination, or intercourse unless otherwise stated above.  Pertinent History Reviewed:   Reviewed past medical,surgical, social and family history.  Reviewed problem list, medications and allergies.  Physical Assessment:   Vitals:   11/28/21 1534  BP: 130/83  Pulse: (!) 55  Temp: 98.2 F (36.8 C)  TempSrc: Oral  SpO2: 100%  Weight: 136 lb (61.7 kg)  Height: 5\' 3"  (1.6 m)  Body mass index is 24.09 kg/m.        Physical Examination:  General appearance - well appearing, and in no distress Mental status - alert, oriented to person, place, and time Psych:  She has a normal mood and affect Skin - warm and dry, normal color, no suspicious lesions noted Chest - effort normal, all lung fields clear to auscultation bilaterally Heart - normal rate and regular rhythm Neck:  midline trachea, no thyromegaly or nodules Breasts - breasts appear normal, no suspicious masses, no skin or nipple changes or axillary nodes Educated patient on proper self breast examination and had patient to demonstrate SBE. Abdomen - soft, nontender, nondistended, no masses or organomegaly Pelvic-VULVA: normal appearing vulva with no masses, tenderness or lesions   VAGINA: normal appearing vagina with normal color and discharge, no lesions   CERVIX: normal  appearing cervix without discharge or lesions, no CMT UTERUS: uterus is felt to be normal size, shape, consistency and nontender  ADNEXA: No adnexal masses or tenderness noted. Extremities:  No swelling or varicosities noted  No results found for this or any previous visit (from the past 24 hour(s)).   Assessment & Plan:  Theresa Avila was seen today for gynecologic exam.  Diagnoses and all orders for this visit:  Screening for STD (sexually transmitted disease) -     Cervicovaginal ancillary only  Cervical cancer screening -     Cytology - PAP([place])  Meds: No orders of the defined types were placed in this encounter.   Follow-up: Return in about 6 months (around 05/31/2022) for Bp ck .  This note has been created with 06/02/2022. Any transcriptional errors are unintentional.   Education officer, environmental, NP 11/28/2021, 4:16 PM

## 2021-11-30 LAB — CERVICOVAGINAL ANCILLARY ONLY
Bacterial Vaginitis (gardnerella): POSITIVE — AB
Candida Glabrata: NEGATIVE
Candida Vaginitis: NEGATIVE
Chlamydia: NEGATIVE
Comment: NEGATIVE
Comment: NEGATIVE
Comment: NEGATIVE
Comment: NEGATIVE
Comment: NEGATIVE
Comment: NORMAL
Neisseria Gonorrhea: NEGATIVE
Trichomonas: POSITIVE — AB

## 2021-12-02 ENCOUNTER — Other Ambulatory Visit (INDEPENDENT_AMBULATORY_CARE_PROVIDER_SITE_OTHER): Payer: Self-pay | Admitting: Primary Care

## 2021-12-02 DIAGNOSIS — A599 Trichomoniasis, unspecified: Secondary | ICD-10-CM

## 2021-12-02 MED ORDER — METRONIDAZOLE 500 MG PO TABS: 500.0000 mg | ORAL_TABLET | Freq: Two times a day (BID) | ORAL | 0 refills | Status: DC

## 2021-12-05 LAB — CYTOLOGY - PAP
Chlamydia: NEGATIVE
Comment: NEGATIVE
Comment: NEGATIVE
Comment: NEGATIVE
Comment: NORMAL
Diagnosis: NEGATIVE
High risk HPV: NEGATIVE
Neisseria Gonorrhea: NEGATIVE
Trichomonas: POSITIVE — AB

## 2022-04-07 ENCOUNTER — Ambulatory Visit (INDEPENDENT_AMBULATORY_CARE_PROVIDER_SITE_OTHER): Payer: Medicaid Other | Admitting: Primary Care

## 2022-10-13 ENCOUNTER — Other Ambulatory Visit: Payer: Self-pay

## 2022-10-13 ENCOUNTER — Emergency Department (HOSPITAL_COMMUNITY)
Admission: EM | Admit: 2022-10-13 | Discharge: 2022-10-13 | Disposition: A | Discharge status: Home / Self Care | Payer: 59 | Attending: Emergency Medicine | Admitting: Emergency Medicine

## 2022-10-13 ENCOUNTER — Encounter (HOSPITAL_COMMUNITY): Payer: Self-pay

## 2022-10-13 ENCOUNTER — Emergency Department (HOSPITAL_COMMUNITY): Payer: 59

## 2022-10-13 DIAGNOSIS — Z20822 Contact with and (suspected) exposure to covid-19: Secondary | ICD-10-CM | POA: Insufficient documentation

## 2022-10-13 DIAGNOSIS — H9313 Tinnitus, bilateral: Secondary | ICD-10-CM | POA: Insufficient documentation

## 2022-10-13 DIAGNOSIS — J029 Acute pharyngitis, unspecified: Secondary | ICD-10-CM | POA: Insufficient documentation

## 2022-10-13 DIAGNOSIS — R519 Headache, unspecified: Secondary | ICD-10-CM | POA: Diagnosis not present

## 2022-10-13 HISTORY — DX: Essential (primary) hypertension: I10

## 2022-10-13 LAB — SARS CORONAVIRUS 2 BY RT PCR: SARS Coronavirus 2 by RT PCR: NEGATIVE

## 2022-10-13 LAB — GROUP A STREP BY PCR: Group A Strep by PCR: NOT DETECTED

## 2022-10-13 NOTE — ED Triage Notes (Signed)
Pt arrived POV w/ bilateral tinnitus, sore throat, headache onset 10-10-22. Pt has been treating s/s at home and nothing has helped.

## 2022-10-13 NOTE — Discharge Instructions (Signed)
Follow-up with your doctor as needed.  Motrin or Tylenol may help with the pain and decongestants may help with the ringing in the ears.

## 2022-10-13 NOTE — ED Provider Notes (Signed)
[place] EMERGENCY DEPARTMENT AT [facility] Provider Note   CSN: [id number] Arrival date & time: 10/13/22  1014     History  Chief Complaint  Patient presents with   Tinnitus   Sore Throat    Theresa Avila is a 37 y.o. female.   Sore Throat  Patient presents with sore throat headache and ringing in her ears.  No fever.  Does have sore throat.  Has been taking some fatigue to help with sore throat.  Has dull headache.  Does not typically get headaches like this.  Also states ringing in both of her ears.  No known sick contacts.  Denies pregnancy possibility.     Home Medications Prior to Admission medications   Medication Sig Start Date End Date Taking? Authorizing Provider  hydrochlorothiazide (HYDRODIURIL) 25 MG tablet Take 1 tablet (25 mg total) by mouth daily. 09/05/21   Grayce Sessions, NP  metroNIDAZOLE (FLAGYL) 500 MG tablet Take 1 tablet (500 mg total) by mouth 2 (two) times daily. 12/02/21   Grayce Sessions, NP      Allergies    Patient has no known allergies.    Review of Systems   Review of Systems  Physical Exam Updated Vital Signs BP 136/65   Pulse (!) 52   Temp 98.3 F (36.8 C) (Oral)   Resp 18   Ht 5\' 3"  (1.6 m)   Wt 60.8 kg   SpO2 100%   BMI 23.74 kg/m  Physical Exam Vitals and nursing note reviewed.  HENT:     Head: Atraumatic.     Right Ear: Tympanic membrane normal.     Left Ear: Tympanic membrane normal.     Mouth/Throat:     Pharynx: Posterior oropharyngeal erythema present. No oropharyngeal exudate.  Cardiovascular:     Rate and Rhythm: Normal rate.  Pulmonary:     Breath sounds: No rhonchi.  Skin:    Capillary Refill: Capillary refill takes less than 2 seconds.  Neurological:     Mental Status: She is alert.     ED Results / Procedures / Treatments   Labs (all labs ordered are listed, but only abnormal results are displayed) Labs Reviewed  GROUP A STREP BY PCR  SARS CORONAVIRUS 2 BY RT PCR     EKG None  Radiology CT HEAD WO CONTRAST ( )  Result Date: 10/13/2022 CLINICAL DATA:  Headache, increasing frequency or severity EXAM: CT HEAD WITHOUT CONTRAST TECHNIQUE: Contiguous axial images were obtained from the base of the skull through the vertex without intravenous contrast. RADIATION DOSE REDUCTION: This exam was performed according to the departmental dose-optimization program which includes automated exposure control, adjustment of the mA and/or kV according to patient size and/or use of iterative reconstruction technique. COMPARISON:  CT Head 05/05/04 FINDINGS: Brain: No evidence of acute infarction, hemorrhage, hydrocephalus, extra-axial collection or mass lesion/mass effect. Vascular: No hyperdense vessel or unexpected calcification. Skull: Normal. Negative for fracture or focal lesion. Sinuses/Orbits: No middle ear or mastoid effusion. Paranasal sinuses are clear. Orbits are unremarkable. Paranasal sinuses are clear. Other: None. IMPRESSION: No acute intracranial abnormality. No specific etiology for headaches identified. Electronically Signed   By: Lorenza Cambridge M.D.   On: 10/13/2022 13:38    Procedures Procedures    Medications Ordered in ED Medications - No data to display  ED Course/ Medical Decision Making/ A&P  Medical Decision Making Amount and/or Complexity of Data Reviewed Radiology: ordered.   Patient with headache sore throat and ringing in her ears.  Has had her last couple days.  Well-appearing.  Will check COVID and strep testing.  Will get head CT due to different headache.  Differential diagnosis does include strep pharyngitis, COVID, intracranial pathology.  Strep and COVID test negative.  Head CT reassuring.  I think likely URI/pharyngitis with tinnitus secondary to irritation.  Can take decongestants.  Follow-up as an outpatient as needed.  Will discharge home.       Final Clinical Impression(s) / ED Diagnoses Final  diagnoses:  Pharyngitis, unspecified etiology  Tinnitus of both ears    Rx / DC Orders ED Discharge Orders     None         Benjiman Core, MD 10/13/22 1606

## 2023-02-05 ENCOUNTER — Ambulatory Visit (INDEPENDENT_AMBULATORY_CARE_PROVIDER_SITE_OTHER): Payer: Self-pay

## 2023-02-05 NOTE — Telephone Encounter (Signed)
  Chief Complaint: blurred vision Symptoms: constant, cannot see from 4 feet from an object clearly  Frequency: Monday Pertinent Negatives: Patient denies headache, pain in eyes, pain down to thigh Disposition: [] ED /[] Urgent Care (no appt availability in office) / [] Appointment(In office/virtual)/ []  [facility]/ [] Home Care/ [] Refused Recommended Disposition /[] [facility]/ []  Follow-up with PCP Additional Notes: Pt stated has h/o HTN. Works at [employer] - pt stated will go to ED to have BP checked and will call back if BP not high and will stay in ED if high. Reason for Disposition  [1] Blurred vision or visual changes AND [2] gradual onset (e.g., weeks, months)  Answer Assessment - Initial Assessment Questions 1. DESCRIPTION: "How has your vision changed?" (e.g., complete vision loss, blurred vision, double vision, floaters, etc.)     Blurred vision 4 feet away  2. LOCATION: "One or both eyes?" If one, ask: "Which eye?"     Both eyes 3. SEVERITY: "Can you see anything?" If Yes, ask: "What can you see?" (e.g., fine print)     *No Answer* 4. ONSET: "When did this begin?" "Did it start suddenly or has this been gradual?"     Monday - blurred that went away  5. PATTERN: "Does this come and go, or has it been constant since it started?"     *No Answer* 6. PAIN: "Is there any pain in your eye(s)?"  (Scale 1-10; or mild, moderate, severe)   - NONE (0): No pain.   - MILD (1-3): Doesn't interfere with normal activities.   - MODERATE (4-7): Interferes with normal activities or awakens from sleep.    - SEVERE (8-10): Excruciating pain, unable to do any normal activities.     no 7. CONTACTS-GLASSES: "Do you wear contacts or glasses?"     no 8. CAUSE: "What do you think is causing this visual problem?"     *No Answer* 9. OTHER SYMPTOMS: "Do you have any other symptoms?" (e.g., confusion, headache, arm or leg weakness, speech problems)     Pain to thigh comes and goes 10.  PREGNANCY: "Is there any chance you are pregnant?" "When was your last menstrual period?"       N/a  Protocols used: Vision Loss or Change-A-AH

## 2023-02-07 ENCOUNTER — Encounter (HOSPITAL_COMMUNITY): Payer: Self-pay | Admitting: Emergency Medicine

## 2023-02-07 ENCOUNTER — Other Ambulatory Visit: Payer: Self-pay

## 2023-02-07 ENCOUNTER — Emergency Department (HOSPITAL_COMMUNITY)
Admission: EM | Admit: 2023-02-07 | Discharge: 2023-02-08 | Disposition: A | Discharge status: Against Medical Advice | Payer: 59 | Attending: Emergency Medicine | Admitting: Emergency Medicine

## 2023-02-07 DIAGNOSIS — H538 Other visual disturbances: Secondary | ICD-10-CM | POA: Diagnosis present

## 2023-02-07 DIAGNOSIS — Z5321 Procedure and treatment not carried out due to patient leaving prior to being seen by health care provider: Secondary | ICD-10-CM | POA: Insufficient documentation

## 2023-02-07 DIAGNOSIS — I10 Essential (primary) hypertension: Secondary | ICD-10-CM | POA: Insufficient documentation

## 2023-02-07 LAB — CBC
HCT: 38 % (ref 36.0–46.0)
Hemoglobin: 11.8 g/dL — ABNORMAL LOW (ref 12.0–15.0)
MCH: 30.6 pg (ref 26.0–34.0)
MCHC: 31.1 g/dL (ref 30.0–36.0)
MCV: 98.4 fL (ref 80.0–100.0)
Platelets: 262 10*3/uL (ref 150–400)
RBC: 3.86 MIL/uL — ABNORMAL LOW (ref 3.87–5.11)
RDW: 12.9 % (ref 11.5–15.5)
WBC: 7.7 10*3/uL (ref 4.0–10.5)
nRBC: 0 % (ref 0.0–0.2)

## 2023-02-07 LAB — HCG, SERUM, QUALITATIVE: Preg, Serum: NEGATIVE

## 2023-02-07 LAB — BASIC METABOLIC PANEL
Anion gap: 9 (ref 5–15)
BUN: 9 mg/dL (ref 6–20)
CO2: 22 mmol/L (ref 22–32)
Calcium: 9.5 mg/dL (ref 8.9–10.3)
Chloride: 108 mmol/L (ref 98–111)
Creatinine, Ser: 0.67 mg/dL (ref 0.44–1.00)
GFR, Estimated: >60 mL/min (ref >60)
Glucose, Bld: 86 mg/dL (ref 70–99)
Potassium: 4.1 mmol/L (ref 3.5–5.1)
Sodium: 139 mmol/L (ref 135–145)

## 2023-02-07 NOTE — ED Triage Notes (Signed)
Patient arrives ambulatory by POV c/p blurred vision since last Thursday. Patient states she has been out of her BP medicine for months now.

## 2023-02-08 ENCOUNTER — Encounter (HOSPITAL_COMMUNITY): Payer: Self-pay

## 2023-02-08 ENCOUNTER — Other Ambulatory Visit: Payer: Self-pay

## 2023-02-08 ENCOUNTER — Telehealth (INDEPENDENT_AMBULATORY_CARE_PROVIDER_SITE_OTHER): Payer: Self-pay | Admitting: Primary Care

## 2023-02-08 ENCOUNTER — Emergency Department (HOSPITAL_COMMUNITY)
Admission: EM | Admit: 2023-02-08 | Discharge: 2023-02-08 | Disposition: A | Discharge status: Home / Self Care | Payer: 59 | Source: Home / Self Care | Attending: Emergency Medicine | Admitting: Emergency Medicine

## 2023-02-08 DIAGNOSIS — H538 Other visual disturbances: Secondary | ICD-10-CM | POA: Diagnosis not present

## 2023-02-08 DIAGNOSIS — I10 Essential (primary) hypertension: Secondary | ICD-10-CM | POA: Insufficient documentation

## 2023-02-08 MED ORDER — HYDROCHLOROTHIAZIDE 25 MG PO TABS
25.0000 mg | ORAL_TABLET | Freq: Every day | ORAL | 0 refills | Status: DC
Start: 2023-02-08 — End: 2023-03-13

## 2023-02-08 NOTE — ED Triage Notes (Addendum)
Pt states bp was 180/140 last night. Pt c/o blurred visionx1wk. Pt states she has been out of bp meds for the past 3 mos. Pt states was here last night, but left, because the wait was too long.

## 2023-02-08 NOTE — ED Provider Notes (Signed)
EMERGENCY DEPARTMENT AT [facility] Provider Note   CSN: [id number] Arrival date & time: 02/08/23  4010     History  No chief complaint on file.   Theresa Avila is a 37 y.o. female.  37 year old female presents today for concern of elevated blood pressure and blurred vision in the left eye for 1 week.  She denies any pain with extraocular movements, headache, neck pain, neck stiffness.  No fever.  She states she has been off of her blood pressure medication for 90 days.  She reached out to her PCP for refill and is waiting to hear back.  She denies wearing any corrective lenses.  The history is provided by the patient. No language interpreter was used.       Home Medications Prior to Admission medications   Medication Sig Start Date End Date Taking? Authorizing Provider  hydrochlorothiazide (HYDRODIURIL) 25 MG tablet Take 1 tablet (25 mg total) by mouth daily. 09/05/21   Grayce Sessions, NP  metroNIDAZOLE (FLAGYL) 500 MG tablet Take 1 tablet (500 mg total) by mouth 2 (two) times daily. 12/02/21   Grayce Sessions, NP      Allergies    Patient has no known allergies.    Review of Systems   Review of Systems  Constitutional:  Negative for fever.  Eyes:  Positive for visual disturbance. Negative for photophobia, pain, discharge, redness and itching.  Respiratory:  Negative for cough and shortness of breath.   Cardiovascular:  Negative for chest pain.  Neurological:  Negative for light-headedness.  All other systems reviewed and are negative.   Physical Exam Updated Vital Signs BP (!) 131/95   Pulse 65   Temp 98.2 F (36.8 C) (Oral)   Resp 16   Ht 5\' 2"  (1.575 m)   Wt 62.6 kg   LMP 01/25/2023   SpO2 100%   BMI 25.24 kg/m  Physical Exam Vitals and nursing note reviewed.  Constitutional:      General: She is not in acute distress.    Appearance: Normal appearance. She is not ill-appearing.  HENT:     Head: Normocephalic and atraumatic.      Nose: Nose normal.  Eyes:     Conjunctiva/sclera: Conjunctivae normal.  Cardiovascular:     Rate and Rhythm: Normal rate and regular rhythm.     Heart sounds: Normal heart sounds.  Pulmonary:     Effort: Pulmonary effort is normal. No respiratory distress.  Musculoskeletal:        General: No deformity. Normal range of motion.  Skin:    Findings: No rash.  Neurological:     General: No focal deficit present.     Mental Status: She is alert and oriented to person, place, and time. Mental status is at baseline.     Cranial Nerves: No cranial nerve deficit.     ED Results / Procedures / Treatments   Labs (all labs ordered are listed, but only abnormal results are displayed) Labs Reviewed - No data to display  EKG None  Radiology No results found.  Procedures Procedures    Medications Ordered in ED Medications - No data to display  ED Course/ Medical Decision Making/ A&P                                 Medical Decision Making  37 year old female presents today for concern of elevated blood pressure, and blurry  vision in the left eye for about 1 week.  She states she reached out to her PCP regarding the blurred vision in the request that she check her blood pressure.  She came to the emergency department last night to have her blood pressure checked.  It was elevated but she could not wait due to extended wait time.  She returned again this morning.  She denies any chest pain, shortness of breath, pain with EOMs or other complaint.  Denies wearing corrective lenses.  We discussed obtaining additional workup including brain imaging however she defers and would like to follow-up outpatient with ophthalmology and start her HCTZ.  She has been noncompliant with this for 90 days.  She states she has an appointment with her PCP on the 21st.  Return precautions given.  Patient voices understanding and is in agreement with plan.  Final Clinical Impression(s) / ED Diagnoses Final  diagnoses:  Uncontrolled hypertension    Rx / DC Orders ED Discharge Orders          Ordered    hydrochlorothiazide (HYDRODIURIL) 25 MG tablet  Daily        02/08/23 1123              Marita Kansas, PA-C 02/08/23 1155    Alvira Monday, MD 02/08/23 2218

## 2023-02-08 NOTE — Discharge Instructions (Signed)
Your medication has been refilled.  I have given you referral to ophthalmology.  Please schedule your appointment.  Follow-up with your primary care provider.  For any concerning symptoms return to the emergency room.  We discussed additional workup however you deferred at this time.  If you have any worsening vision change, balance issues please return to the emergency room.  If you develop chest pain and shortness of breath please return to the emergency room.

## 2023-02-08 NOTE — ED Notes (Signed)
Pt called for room x3 with no answer. 

## 2023-02-08 NOTE — Telephone Encounter (Signed)
Medication Refill - Medication: hydrochlorothiazide (HYDRODIURIL) 25 MG tablet [[id number]]   Has the patient contacted their pharmacy? Yes.    (Agent: If yes, when and what did the pharmacy advise?) Contact PCP   Preferred Pharmacy (with phone number or street name): [facility] [address]   Has the patient been seen for an appointment in the last year OR does the patient have an upcoming appointment? Yes.    Agent: Please be advised that RX refills may take up to 3 business days. We ask that you follow-up with your pharmacy.   Pt is requesting a short supply to last until her appointment on the 21st.

## 2023-02-08 NOTE — Telephone Encounter (Signed)
Patient seen today in ED and this medication was sent in for refill.

## 2023-02-19 ENCOUNTER — Ambulatory Visit (INDEPENDENT_AMBULATORY_CARE_PROVIDER_SITE_OTHER): Payer: 59 | Admitting: Primary Care

## 2023-02-28 ENCOUNTER — Telehealth (INDEPENDENT_AMBULATORY_CARE_PROVIDER_SITE_OTHER): Payer: 59 | Admitting: Primary Care

## 2023-03-13 ENCOUNTER — Ambulatory Visit (INDEPENDENT_AMBULATORY_CARE_PROVIDER_SITE_OTHER): Payer: 59 | Admitting: Primary Care

## 2023-03-13 ENCOUNTER — Encounter (INDEPENDENT_AMBULATORY_CARE_PROVIDER_SITE_OTHER): Payer: Self-pay | Admitting: Primary Care

## 2023-03-13 VITALS — BP 130/87 | HR 73 | Resp 16 | Ht 62.0 in | Wt 124.8 lb

## 2023-03-13 DIAGNOSIS — A599 Trichomoniasis, unspecified: Secondary | ICD-10-CM | POA: Diagnosis not present

## 2023-03-13 DIAGNOSIS — I10 Essential (primary) hypertension: Secondary | ICD-10-CM

## 2023-03-13 MED ORDER — HYDROCHLOROTHIAZIDE 25 MG PO TABS
25.0000 mg | ORAL_TABLET | Freq: Every day | ORAL | 1 refills | Status: AC
Start: 2023-03-13 — End: ?

## 2023-03-13 NOTE — Progress Notes (Signed)
[facility]  Theresa Avila, is a 37 y.o. female  ZOX:[id number]  WJX:[id number]  DOB - 1985/10/21  Chief Complaint  Patient presents with   Hospitalization Follow-up    ED       Subjective:   Theresa Avila is a 37 y.o. female here today for an acute an patient brought her significant other to her appointment for trichomonas.  Unfortunately she will have to return due to she is on her menstrual cycle for ancillary swab.  Partner is also a patient and he will come in for STD screening. visit. Exposure to STD  Chronicity: last July never knew she had Trichomonis no medication. The current episode started more than 1 year ago. The vaginal discharge was normal. She has tried nothing for the symptoms. Improvement on treatment: never treated. Risk factors: one partner.  Patient has No headache, No chest pain, No abdominal pain - No Nausea, No new weakness tingling or numbness, No Cough - shortness of breath  No problems updated.  No Known Allergies  Past Medical History:  Diagnosis Date   Hypertension     No current outpatient medications on file prior to visit.   No current facility-administered medications on file prior to visit.    Objective:   Vitals:   03/13/23 1635  BP: 130/87  Pulse: 73  Resp: 16  SpO2: 100%  Weight: 124 lb 12.8 oz (56.6 kg)  Height: 5\' 2"  (1.575 m)    Comprehensive ROS Pertinent positive and negative noted in HPI   Exam General appearance : Awake, alert, not in any distress. Speech Clear. Not toxic looking HEENT: Atraumatic and Normocephalic, pupils equally reactive to light and accomodation Neck: Supple, no JVD. No cervical lymphadenopathy.  Chest: Good air entry bilaterally, no added sounds  CVS: S1 S2 regular, no murmurs.  Abdomen: Bowel sounds present, Non tender and not distended with no gaurding, rigidity or rebound. Extremities: B/L Lower Ext shows no edema, both legs are warm to touch Neurology: Awake alert, and oriented X  3, CN II-XII intact, Non focal Skin: No Rash  Data Review No results found for: "HGBA1C"  Assessment & Plan   Saya was seen today for hospitalization follow-up.  Diagnoses and all orders for this visit:  Trichomoniasis -     Cancel: Cervicovaginal ancillary only  Essential hypertension BP goal - < 130/80 Explained that having normal blood pressure is the goal and medications are helping to get to goal and maintain normal blood pressure. DIET: Limit salt intake, read nutrition labels to check salt content, limit fried and high fatty foods  Avoid using multisymptom OTC cold preparations that generally contain sudafed which can rise BP. Consult with pharmacist on best cold relief products to use for persons with HTN EXERCISE Discussed incorporating exercise such as walking - 30 minutes most days of the week and can do in 10 minute intervals    -     For home use only DME Other see comment -     hydrochlorothiazide (HYDRODIURIL) 25 MG tablet; Take 1 tablet (25 mg total) by mouth daily. Take  Patient have been counseled extensively about nutrition and exercise. Other issues discussed during this visit include: low cholesterol diet, weight control and daily exercise, foot care, annual eye examinations at Ophthalmology, importance of adherence with medications and regular follow-up. We also discussed long term complications of uncontrolled diabetes and hypertension.   Schedule for the following week  The patient was given clear instructions to go to ER or  return to medical center if symptoms don't improve, worsen or new problems develop. The patient verbalized understanding. The patient was told to call to get lab results if they haven't heard anything in the next week.   This note has been created with Education officer, environmental. Any transcriptional errors are unintentional.   Grayce Sessions, NP 03/18/2023, 6:29 PM

## 2023-03-23 ENCOUNTER — Telehealth (INDEPENDENT_AMBULATORY_CARE_PROVIDER_SITE_OTHER): Payer: Self-pay | Admitting: Primary Care

## 2023-03-23 ENCOUNTER — Telehealth (INDEPENDENT_AMBULATORY_CARE_PROVIDER_SITE_OTHER): Payer: Self-pay

## 2023-03-23 ENCOUNTER — Telehealth: Payer: Self-pay | Admitting: Primary Care

## 2023-03-23 NOTE — Telephone Encounter (Signed)
Copied from CRM [id number]. Topic: General - Inquiry >> Mar 23, 2023  7:47 AM De [place]anch wrote: Reason for CRM: Pt is calling requesting an appointment for a vaginal swab. Per her last visit 03/13/2023 with her PCP to return for STD testing.  Please advise.

## 2023-03-23 NOTE — Telephone Encounter (Signed)
Pt is returning the missed call from you

## 2023-03-23 NOTE — Telephone Encounter (Signed)
Please contact pt and schedule a nurse visit for vaginal swab

## 2023-03-23 NOTE — Telephone Encounter (Signed)
Called to schedule apt for pt.

## 2023-03-23 NOTE — Telephone Encounter (Signed)
Called pt to schedule apt. Pt did not answer and could not leave VM

## 2023-03-26 ENCOUNTER — Ambulatory Visit (INDEPENDENT_AMBULATORY_CARE_PROVIDER_SITE_OTHER): Payer: 59

## 2023-06-04 ENCOUNTER — Telehealth: Payer: No Typology Code available for payment source | Admitting: Physician Assistant

## 2023-06-04 ENCOUNTER — Encounter: Payer: Self-pay | Admitting: Physician Assistant

## 2023-06-04 DIAGNOSIS — J111 Influenza due to unidentified influenza virus with other respiratory manifestations: Secondary | ICD-10-CM | POA: Diagnosis not present

## 2023-06-04 DIAGNOSIS — R051 Acute cough: Secondary | ICD-10-CM

## 2023-06-04 DIAGNOSIS — G43909 Migraine, unspecified, not intractable, without status migrainosus: Secondary | ICD-10-CM | POA: Diagnosis not present

## 2023-06-04 DIAGNOSIS — R110 Nausea: Secondary | ICD-10-CM | POA: Diagnosis not present

## 2023-06-04 MED ORDER — ONDANSETRON 8 MG PO TBDP
8.0000 mg | ORAL_TABLET | Freq: Three times a day (TID) | ORAL | 0 refills | Status: AC | PRN
Start: 2023-06-04 — End: ?

## 2023-06-04 MED ORDER — SUMATRIPTAN SUCCINATE 50 MG PO TABS
50.0000 mg | ORAL_TABLET | ORAL | 0 refills | Status: AC | PRN
Start: 2023-06-04 — End: ?

## 2023-06-04 MED ORDER — BENZONATATE 100 MG PO CAPS
ORAL_CAPSULE | ORAL | 0 refills | Status: AC
Start: 2023-06-04 — End: ?

## 2023-06-04 NOTE — Patient Instructions (Signed)
Arne Cleveland, thank you for joining Roney Jaffe, PA-C for today's virtual visit.  While this provider is not your primary care provider (PCP), if your PCP is located in our provider database this encounter information will be shared with them immediately following your visit.   A [facility] MyChart account gives you access to today's visit and all your visits, tests, and labs performed at [facility] " click here if you don't have a [facility] MyChart account or go to [email or web address]  Consent: (Patient) Arne Cleveland provided verbal consent for this virtual visit at the beginning of the encounter.  Current Medications:  Current Outpatient Medications:    benzonatate (TESSALON) 100 MG capsule, Take 1-2 caps PO TID PRN, Disp: 20 capsule, Rfl: 0   ondansetron (ZOFRAN-ODT) 8 MG disintegrating tablet, Take 1 tablet (8 mg total) by mouth every 8 (eight) hours as needed for nausea or vomiting., Disp: 10 tablet, Rfl: 0   SUMAtriptan (IMITREX) 50 MG tablet, Take 1 tablet (50 mg total) by mouth every 2 (two) hours as needed for migraine. May repeat in 2 hours if headache persists or recurs., Disp: 10 tablet, Rfl: 0   hydrochlorothiazide (HYDRODIURIL) 25 MG tablet, Take 1 tablet (25 mg total) by mouth daily., Disp: 90 tablet, Rfl: 1   Medications ordered in this encounter:  Meds ordered this encounter  Medications   SUMAtriptan (IMITREX) 50 MG tablet    Sig: Take 1 tablet (50 mg total) by mouth every 2 (two) hours as needed for migraine. May repeat in 2 hours if headache persists or recurs.    Dispense:  10 tablet    Refill:  0    Supervising Provider:   Merrilee Jansky [[id number]]   ondansetron (ZOFRAN-ODT) 8 MG disintegrating tablet    Sig: Take 1 tablet (8 mg total) by mouth every 8 (eight) hours as needed for nausea or vomiting.    Dispense:  10 tablet    Refill:  0    Supervising Provider:   Merrilee Jansky [[id number]]   benzonatate (TESSALON) 100 MG capsule     Sig: Take 1-2 caps PO TID PRN    Dispense:  20 capsule    Refill:  0    Supervising Provider:   Merrilee Jansky [[id number]]     *If you need refills on other medications prior to your next appointment, please contact your pharmacy*  Follow-Up: Call back or seek an in-person evaluation if the symptoms worsen or if the condition fails to improve as anticipated.  [facility] [phone]  Other Instructions Upper Respiratory Infection, Adult An upper respiratory infection (URI) is a common viral infection of the nose, throat, and upper air passages that lead to the lungs. The most common type of URI is the common cold. URIs usually get better on their own, without medical treatment. What are the causes? A URI is caused by a virus. You may catch a virus by: Breathing in droplets from an infected person's cough or sneeze. Touching something that has been exposed to the virus (is contaminated) and then touching your mouth, nose, or eyes. What increases the risk? You are more likely to get a URI if: You are very young or very old. You have close contact with others, such as at work, school, or a health care facility. You smoke. You have long-term (chronic) heart or lung disease. You have a weakened disease-fighting system (immune system). You have nasal allergies or asthma. You are  experiencing a lot of stress. You have poor nutrition. What are the signs or symptoms? A URI usually involves some of the following symptoms: Runny or stuffy (congested) nose. Cough. Sneezing. Sore throat. Headache. Fatigue. Fever. Loss of appetite. Pain in your forehead, behind your eyes, and over your cheekbones (sinus pain). Muscle aches. Redness or irritation of the eyes. Pressure in the ears or face. How is this diagnosed? This condition may be diagnosed based on your medical history and symptoms, and a physical exam. Your health care provider may use a swab to take a mucus sample  from your nose (nasal swab). This sample can be tested to determine what virus is causing the illness. How is this treated? URIs usually get better on their own within 7-10 days. Medicines cannot cure URIs, but your health care provider may recommend certain medicines to help relieve symptoms, such as: Over-the-counter cold medicines. Cough suppressants. Coughing is a type of defense against infection that helps to clear the respiratory system, so take these medicines only as recommended by your health care provider. Fever-reducing medicines. Follow these instructions at home: Activity Rest as needed. If you have a fever, stay home from work or school until your fever is gone or until your health care provider says your URI cannot spread to other people (is no longer contagious). Your health care provider may have you wear a face mask to prevent your infection from spreading. Relieving symptoms Gargle with a mixture of salt and water 3-4 times a day or as needed. To make salt water, completely dissolve -1 tsp (3-6 g) of salt in 1 cup (237 mL) of warm water. Use a cool-mist humidifier to add moisture to the air. This can help you breathe more easily. Eating and drinking  Drink enough fluid to keep your urine pale yellow. Eat soups and other clear broths. General instructions  Take over-the-counter and prescription medicines only as told by your health care provider. These include cold medicines, fever reducers, and cough suppressants. Do not use any products that contain nicotine or tobacco. These products include cigarettes, chewing tobacco, and vaping devices, such as e-cigarettes. If you need help quitting, ask your health care provider. Stay away from secondhand smoke. Stay up to date on all immunizations, including the yearly (annual) flu vaccine. Keep all follow-up visits. This is important. How to prevent the spread of infection to others URIs can be contagious. To prevent the  infection from spreading: Wash your hands with soap and water for at least 20 seconds. If soap and water are not available, use hand sanitizer. Avoid touching your mouth, face, eyes, or nose. Cough or sneeze into a tissue or your sleeve or elbow instead of into your hand or into the air.  Contact a health care provider if: You are getting worse instead of better. You have a fever or chills. Your mucus is [place] or red. You have yellow or [place] discharge coming from your nose. You have pain in your face, especially when you bend forward. You have swollen neck glands. You have pain while swallowing. You have white areas in the back of your throat. Get help right away if: You have shortness of breath that gets worse. You have severe or persistent: Headache. Ear pain. Sinus pain. Chest pain. You have chronic lung disease along with any of the following: Making high-pitched whistling sounds when you breathe, most often when you breathe out (wheezing). Prolonged cough (more than 14 days). Coughing up blood. A change in your  usual mucus. You have a stiff neck. You have changes in your: Vision. Hearing. Thinking. Mood. These symptoms may be an emergency. Get help right away. Call 911. Do not wait to see if the symptoms will go away. Do not drive yourself to the hospital. Summary An upper respiratory infection (URI) is a common infection of the nose, throat, and upper air passages that lead to the lungs. A URI is caused by a virus. URIs usually get better on their own within 7-10 days. Medicines cannot cure URIs, but your health care provider may recommend certain medicines to help relieve symptoms. This information is not intended to replace advice given to you by your health care provider. Make sure you discuss any questions you have with your health care provider. Document Revised: 11/17/2020 Document Reviewed: 11/17/2020 Elsevier Patient Education  2024 Elsevier Inc.   If you  have been instructed to have an in-person evaluation today at a local Urgent Care facility, please use the link below. It will take you to a list of all of our available [facility], including address, phone number and hours of operation. Please do not delay care.  [facility]  If you or a family member do not have a primary care provider, use the link below to schedule a visit and establish care. When you choose a [facility] primary care physician or advanced practice provider, you gain a long-term partner in health. Find a Primary Care Provider  Learn more about [facility] in-office and virtual care options: [facility] - Get Care Now

## 2023-06-04 NOTE — Progress Notes (Signed)
Virtual Visit Consent   Arne Cleveland, you are scheduled for a virtual visit with a [facility] provider today. Just as with appointments in the office, your consent must be obtained to participate. Your consent will be active for this visit and any virtual visit you may have with one of our providers in the next 365 days. If you have a MyChart account, a copy of this consent can be sent to you electronically.  As this is a virtual visit, video technology does not allow for your provider to perform a traditional examination. This may limit your provider's ability to fully assess your condition. If your provider identifies any concerns that need to be evaluated in person or the need to arrange testing (such as labs, EKG, etc.), we will make arrangements to do so. Although advances in technology are sophisticated, we cannot ensure that it will always work on either your end or our end. If the connection with a video visit is poor, the visit may have to be switched to a telephone visit. With either a video or telephone visit, we are not always able to ensure that we have a secure connection.  By engaging in this virtual visit, you consent to the provision of healthcare and authorize for your insurance to be billed (if applicable) for the services provided during this visit. Depending on your insurance coverage, you may receive a charge related to this service.  I need to obtain your verbal consent now. Are you willing to proceed with your visit today? Theresa Avila has provided verbal consent on 06/04/2023 for a virtual visit (video or telephone). Roney Jaffe, PA-C  Date: 06/04/2023 7:12 PM  Virtual Visit via Video Note   I, Theresa Avila, connected with  Theresa Avila  ([id number], Nov 03, 1985) on 06/04/23 at  7:00 PM EST by a video-enabled telemedicine application and verified that I am speaking with the correct person using two identifiers.  Location: Patient: Virtual Visit Location Patient:  Home Provider: Virtual Visit Location Provider: Home Office   I discussed the limitations of evaluation and management by telemedicine and the availability of in person appointments. The patient expressed understanding and agreed to proceed.    History of Present Illness: Theresa Avila is a 38 y.o. who identifies as a female who was assigned female at birth, presents with a one-day history of flu-like symptoms. She reports a sudden onset of voice loss, cough, migraine headache, body aches, nausea and diarrhea. The symptoms were severe enough to cause her to leave work early. She has been managing her symptoms with chicken broth, Tussin DM for cough, chest congestion, and ibuprofen for the headache. However, the relief from these measures has been temporary and incomplete. She denies vomiting and exposure to sick contacts. The cough is productive of yellow mucus.   Problems: There are no active problems to display for this patient.   Allergies: No Known Allergies Medications:  Current Outpatient Medications:    benzonatate (TESSALON) 100 MG capsule, Take 1-2 caps PO TID PRN, Disp: 20 capsule, Rfl: 0   ondansetron (ZOFRAN-ODT) 8 MG disintegrating tablet, Take 1 tablet (8 mg total) by mouth every 8 (eight) hours as needed for nausea or vomiting., Disp: 10 tablet, Rfl: 0   SUMAtriptan (IMITREX) 50 MG tablet, Take 1 tablet (50 mg total) by mouth every 2 (two) hours as needed for migraine. May repeat in 2 hours if headache persists or recurs., Disp: 10 tablet, Rfl: 0   hydrochlorothiazide (HYDRODIURIL)  25 MG tablet, Take 1 tablet (25 mg total) by mouth daily., Disp: 90 tablet, Rfl: 1  Observations/Objective: Patient is well-developed, well-nourished in no acute distress.  Resting comfortably  at home.  Head is normocephalic, atraumatic.  No labored breathing.  Speech is clear and coherent with logical content.  Patient is alert and oriented at baseline.    Assessment and Plan: 1. Migraine  without status migrainosus, not intractable, unspecified migraine type (Primary) - SUMAtriptan (IMITREX) 50 MG tablet; Take 1 tablet (50 mg total) by mouth every 2 (two) hours as needed for migraine. May repeat in 2 hours if headache persists or recurs.  Dispense: 10 tablet; Refill: 0  2. Acute cough - benzonatate (TESSALON) 100 MG capsule; Take 1-2 caps PO TID PRN  Dispense: 20 capsule; Refill: 0  3. Nausea - ondansetron (ZOFRAN-ODT) 8 MG disintegrating tablet; Take 1 tablet (8 mg total) by mouth every 8 (eight) hours as needed for nausea or vomiting.  Dispense: 10 tablet; Refill: 0  Symptoms of cough, body aches, headache, and diarrhea started yesterday. Cough productive of yellow mucus. No vomiting. No known sick contacts. Currently taking Tussin DM and ibuprofen with minimal relief. -Advise to continue with hydration and soft diet. -Send prescriptions for symptomatic relief including medication for migraine, cough, and nausea. -Advise to obtain a COVID test when picking up medications to rule out COVID-19.  Follow Up Instructions: I discussed the assessment and treatment plan with the patient. The patient was provided an opportunity to ask questions and all were answered. The patient agreed with the plan and demonstrated an understanding of the instructions.  A copy of instructions were sent to the patient via MyChart unless otherwise noted below.     The patient was advised to call back or seek an in-person evaluation if the symptoms worsen or if the condition fails to improve as anticipated.    Kasandra Knudsen Mayers, PA-C

## 2023-06-05 MED ORDER — OSELTAMIVIR PHOSPHATE 75 MG PO CAPS
75.0000 mg | ORAL_CAPSULE | Freq: Two times a day (BID) | ORAL | 0 refills | Status: AC
Start: 2023-06-05 — End: 2023-06-10

## 2023-06-05 NOTE — Addendum Note (Signed)
Addended by: Roney Jaffe on: 06/05/2023 06:30 AM   Modules accepted: Orders

## 2023-06-06 ENCOUNTER — Telehealth (INDEPENDENT_AMBULATORY_CARE_PROVIDER_SITE_OTHER): Payer: Self-pay | Admitting: Primary Care

## 2023-06-06 NOTE — Telephone Encounter (Signed)
 Called pt to remind them about atp. VM left with pt.

## 2023-06-13 ENCOUNTER — Ambulatory Visit (INDEPENDENT_AMBULATORY_CARE_PROVIDER_SITE_OTHER): Payer: 59 | Admitting: Primary Care

## 2023-06-13 ENCOUNTER — Telehealth (INDEPENDENT_AMBULATORY_CARE_PROVIDER_SITE_OTHER): Payer: Self-pay | Admitting: Primary Care

## 2023-06-13 NOTE — Telephone Encounter (Signed)
Called pt to see if they wanted to set up a virtual atp or see if we could get the pt rescheduled. VM left with pt.
